# Patient Record
Sex: Female | Born: 1970 | Race: White | Hispanic: No | Marital: Married | Smoking: Never smoker
Health system: Southern US, Community
[De-identification: ages and names within clinical notes are randomized; demographics above are authoritative.]

## PROBLEM LIST (undated history)

## (undated) DIAGNOSIS — R51 Headache: Secondary | ICD-10-CM

## (undated) DIAGNOSIS — I10 Essential (primary) hypertension: Secondary | ICD-10-CM

## (undated) DIAGNOSIS — G40909 Epilepsy, unspecified, not intractable, without status epilepticus: Secondary | ICD-10-CM

## (undated) DIAGNOSIS — M549 Dorsalgia, unspecified: Secondary | ICD-10-CM

## (undated) DIAGNOSIS — G8929 Other chronic pain: Secondary | ICD-10-CM

## (undated) DIAGNOSIS — G35 Multiple sclerosis: Secondary | ICD-10-CM

## (undated) DIAGNOSIS — M1990 Unspecified osteoarthritis, unspecified site: Secondary | ICD-10-CM

## (undated) DIAGNOSIS — G43909 Migraine, unspecified, not intractable, without status migrainosus: Secondary | ICD-10-CM

## (undated) DIAGNOSIS — G35D Multiple sclerosis, unspecified: Secondary | ICD-10-CM

## (undated) DIAGNOSIS — J45909 Unspecified asthma, uncomplicated: Secondary | ICD-10-CM

## (undated) DIAGNOSIS — R519 Headache, unspecified: Secondary | ICD-10-CM

## (undated) DIAGNOSIS — J189 Pneumonia, unspecified organism: Secondary | ICD-10-CM

## (undated) DIAGNOSIS — K219 Gastro-esophageal reflux disease without esophagitis: Secondary | ICD-10-CM

## (undated) DIAGNOSIS — E7800 Pure hypercholesterolemia, unspecified: Secondary | ICD-10-CM

## (undated) DIAGNOSIS — E119 Type 2 diabetes mellitus without complications: Secondary | ICD-10-CM

## (undated) HISTORY — PX: TONSILLECTOMY: SUR1361

## (undated) HISTORY — PX: APPENDECTOMY: SHX54

## (undated) HISTORY — PX: CARDIAC VALVE REPLACEMENT: SHX585

---

## 2003-05-03 ENCOUNTER — Emergency Department (HOSPITAL_COMMUNITY): Admission: EM | Admit: 2003-05-03 | Discharge: 2003-05-03 | Payer: Self-pay | Admitting: Emergency Medicine

## 2018-12-07 ENCOUNTER — Other Ambulatory Visit: Payer: Self-pay

## 2018-12-07 ENCOUNTER — Encounter (HOSPITAL_COMMUNITY): Payer: Self-pay | Admitting: Emergency Medicine

## 2018-12-07 ENCOUNTER — Inpatient Hospital Stay (HOSPITAL_COMMUNITY)
Admission: EM | Admit: 2018-12-07 | Discharge: 2018-12-12 | DRG: 871 | Disposition: A | Discharge status: Skilled Nursing Facility | Payer: Medicaid Other | Attending: Internal Medicine | Admitting: Internal Medicine

## 2018-12-07 ENCOUNTER — Emergency Department (HOSPITAL_COMMUNITY): Payer: Medicaid Other

## 2018-12-07 DIAGNOSIS — I10 Essential (primary) hypertension: Secondary | ICD-10-CM | POA: Diagnosis present

## 2018-12-07 DIAGNOSIS — G35 Multiple sclerosis: Secondary | ICD-10-CM | POA: Diagnosis present

## 2018-12-07 DIAGNOSIS — J189 Pneumonia, unspecified organism: Secondary | ICD-10-CM | POA: Diagnosis present

## 2018-12-07 DIAGNOSIS — Z952 Presence of prosthetic heart valve: Secondary | ICD-10-CM | POA: Diagnosis not present

## 2018-12-07 DIAGNOSIS — J9601 Acute respiratory failure with hypoxia: Secondary | ICD-10-CM

## 2018-12-07 DIAGNOSIS — E1165 Type 2 diabetes mellitus with hyperglycemia: Secondary | ICD-10-CM | POA: Diagnosis present

## 2018-12-07 DIAGNOSIS — Y95 Nosocomial condition: Secondary | ICD-10-CM | POA: Diagnosis present

## 2018-12-07 DIAGNOSIS — F329 Major depressive disorder, single episode, unspecified: Secondary | ICD-10-CM | POA: Diagnosis present

## 2018-12-07 DIAGNOSIS — Z6834 Body mass index (BMI) 34.0-34.9, adult: Secondary | ICD-10-CM | POA: Diagnosis not present

## 2018-12-07 DIAGNOSIS — Z79899 Other long term (current) drug therapy: Secondary | ICD-10-CM | POA: Diagnosis not present

## 2018-12-07 DIAGNOSIS — A419 Sepsis, unspecified organism: Principal | ICD-10-CM | POA: Diagnosis present

## 2018-12-07 DIAGNOSIS — R569 Unspecified convulsions: Secondary | ICD-10-CM | POA: Diagnosis present

## 2018-12-07 DIAGNOSIS — E785 Hyperlipidemia, unspecified: Secondary | ICD-10-CM | POA: Diagnosis present

## 2018-12-07 DIAGNOSIS — Z7951 Long term (current) use of inhaled steroids: Secondary | ICD-10-CM | POA: Diagnosis not present

## 2018-12-07 DIAGNOSIS — K219 Gastro-esophageal reflux disease without esophagitis: Secondary | ICD-10-CM | POA: Diagnosis present

## 2018-12-07 DIAGNOSIS — Z885 Allergy status to narcotic agent status: Secondary | ICD-10-CM

## 2018-12-07 DIAGNOSIS — R633 Feeding difficulties: Secondary | ICD-10-CM | POA: Diagnosis present

## 2018-12-07 DIAGNOSIS — F419 Anxiety disorder, unspecified: Secondary | ICD-10-CM | POA: Diagnosis present

## 2018-12-07 DIAGNOSIS — J45901 Unspecified asthma with (acute) exacerbation: Secondary | ICD-10-CM | POA: Diagnosis present

## 2018-12-07 DIAGNOSIS — Z794 Long term (current) use of insulin: Secondary | ICD-10-CM | POA: Diagnosis not present

## 2018-12-07 DIAGNOSIS — E669 Obesity, unspecified: Secondary | ICD-10-CM

## 2018-12-07 DIAGNOSIS — E1169 Type 2 diabetes mellitus with other specified complication: Secondary | ICD-10-CM | POA: Diagnosis present

## 2018-12-07 DIAGNOSIS — R0602 Shortness of breath: Secondary | ICD-10-CM | POA: Diagnosis present

## 2018-12-07 HISTORY — DX: Multiple sclerosis: G35

## 2018-12-07 HISTORY — DX: Unspecified osteoarthritis, unspecified site: M19.90

## 2018-12-07 HISTORY — DX: Unspecified asthma, uncomplicated: J45.909

## 2018-12-07 HISTORY — DX: Pneumonia, unspecified organism: J18.9

## 2018-12-07 HISTORY — DX: Headache: R51

## 2018-12-07 HISTORY — DX: Other chronic pain: G89.29

## 2018-12-07 HISTORY — DX: Type 2 diabetes mellitus without complications: E11.9

## 2018-12-07 HISTORY — DX: Epilepsy, unspecified, not intractable, without status epilepticus: G40.909

## 2018-12-07 HISTORY — DX: Pure hypercholesterolemia, unspecified: E78.00

## 2018-12-07 HISTORY — DX: Essential (primary) hypertension: I10

## 2018-12-07 HISTORY — DX: Headache, unspecified: R51.9

## 2018-12-07 HISTORY — DX: Migraine, unspecified, not intractable, without status migrainosus: G43.909

## 2018-12-07 HISTORY — DX: Dorsalgia, unspecified: M54.9

## 2018-12-07 HISTORY — DX: Multiple sclerosis, unspecified: G35.D

## 2018-12-07 HISTORY — DX: Gastro-esophageal reflux disease without esophagitis: K21.9

## 2018-12-07 LAB — CBC WITH DIFFERENTIAL/PLATELET
Abs Immature Granulocytes: 0.03 10*3/uL (ref 0.00–0.07)
Basophils Absolute: 0 10*3/uL (ref 0.0–0.1)
Basophils Relative: 0 %
Eosinophils Absolute: 0.1 10*3/uL (ref 0.0–0.5)
Eosinophils Relative: 2 %
HCT: 34.2 % — ABNORMAL LOW (ref 36.0–46.0)
Hemoglobin: 9.9 g/dL — ABNORMAL LOW (ref 12.0–15.0)
Immature Granulocytes: 0 %
Lymphocytes Relative: 8 %
Lymphs Abs: 0.6 10*3/uL — ABNORMAL LOW (ref 0.7–4.0)
MCH: 22.2 pg — ABNORMAL LOW (ref 26.0–34.0)
MCHC: 28.9 g/dL — ABNORMAL LOW (ref 30.0–36.0)
MCV: 76.9 fL — ABNORMAL LOW (ref 80.0–100.0)
Monocytes Absolute: 0.4 10*3/uL (ref 0.1–1.0)
Monocytes Relative: 6 %
NEUTROS ABS: 6.4 10*3/uL (ref 1.7–7.7)
Neutrophils Relative %: 84 %
Platelets: 200 10*3/uL (ref 150–400)
RBC: 4.45 MIL/uL (ref 3.87–5.11)
RDW: 21 % — ABNORMAL HIGH (ref 11.5–15.5)
WBC: 7.6 10*3/uL (ref 4.0–10.5)
nRBC: 0 % (ref 0.0–0.2)

## 2018-12-07 LAB — COMPREHENSIVE METABOLIC PANEL
ALT: 30 U/L (ref 0–44)
AST: 26 U/L (ref 15–41)
Albumin: 3.4 g/dL — ABNORMAL LOW (ref 3.5–5.0)
Alkaline Phosphatase: 96 U/L (ref 38–126)
Anion gap: 8 (ref 5–15)
BUN: 15 mg/dL (ref 6–20)
CO2: 29 mmol/L (ref 22–32)
Calcium: 8.7 mg/dL — ABNORMAL LOW (ref 8.9–10.3)
Chloride: 102 mmol/L (ref 98–111)
Creatinine, Ser: 0.79 mg/dL (ref 0.44–1.00)
GFR calc Af Amer: >60 mL/min (ref >60)
GFR calc non Af Amer: >60 mL/min (ref >60)
Glucose, Bld: 142 mg/dL — ABNORMAL HIGH (ref 70–99)
Potassium: 4.4 mmol/L (ref 3.5–5.1)
Sodium: 139 mmol/L (ref 135–145)
Total Bilirubin: 0.6 mg/dL (ref 0.3–1.2)
Total Protein: 5.9 g/dL — ABNORMAL LOW (ref 6.5–8.1)

## 2018-12-07 LAB — URINALYSIS, ROUTINE W REFLEX MICROSCOPIC
Bilirubin Urine: NEGATIVE
Glucose, UA: NEGATIVE mg/dL
Hgb urine dipstick: NEGATIVE
Ketones, ur: NEGATIVE mg/dL
Leukocytes, UA: NEGATIVE
Nitrite: NEGATIVE
Protein, ur: NEGATIVE mg/dL
Specific Gravity, Urine: 1.017 (ref 1.005–1.030)
pH: 5 (ref 5.0–8.0)

## 2018-12-07 LAB — I-STAT ARTERIAL BLOOD GAS, ED
Acid-Base Excess: 3 mmol/L — ABNORMAL HIGH (ref 0.0–2.0)
Bicarbonate: 27.8 mmol/L (ref 20.0–28.0)
O2 Saturation: 99 %
PH ART: 7.369 (ref 7.350–7.450)
Patient temperature: 102.7
TCO2: 29 mmol/L (ref 22–32)
pCO2 arterial: 49.4 mmHg — ABNORMAL HIGH (ref 32.0–48.0)
pO2, Arterial: 144 mmHg — ABNORMAL HIGH (ref 83.0–108.0)

## 2018-12-07 LAB — GLUCOSE, CAPILLARY
Glucose-Capillary: 164 mg/dL — ABNORMAL HIGH (ref 70–99)
Glucose-Capillary: 252 mg/dL — ABNORMAL HIGH (ref 70–99)
Glucose-Capillary: 326 mg/dL — ABNORMAL HIGH (ref 70–99)

## 2018-12-07 LAB — RESPIRATORY PANEL BY PCR
Adenovirus: NOT DETECTED
Bordetella pertussis: NOT DETECTED
CORONAVIRUS HKU1-RVPPCR: NOT DETECTED
Chlamydophila pneumoniae: NOT DETECTED
Coronavirus 229E: NOT DETECTED
Coronavirus NL63: NOT DETECTED
Coronavirus OC43: NOT DETECTED
Influenza A: NOT DETECTED
Influenza B: NOT DETECTED
Metapneumovirus: NOT DETECTED
Mycoplasma pneumoniae: NOT DETECTED
PARAINFLUENZA VIRUS 1-RVPPCR: NOT DETECTED
Parainfluenza Virus 2: NOT DETECTED
Parainfluenza Virus 3: NOT DETECTED
Parainfluenza Virus 4: NOT DETECTED
Respiratory Syncytial Virus: NOT DETECTED
Rhinovirus / Enterovirus: NOT DETECTED

## 2018-12-07 LAB — INFLUENZA PANEL BY PCR (TYPE A & B)
Influenza A By PCR: NEGATIVE
Influenza B By PCR: NEGATIVE

## 2018-12-07 LAB — MRSA PCR SCREENING: MRSA by PCR: POSITIVE — AB

## 2018-12-07 LAB — I-STAT CG4 LACTIC ACID, ED
Lactic Acid, Venous: 1.39 mmol/L (ref 0.5–1.9)
Lactic Acid, Venous: 0.91 mmol/L (ref 0.5–1.9)

## 2018-12-07 LAB — HEMOGLOBIN A1C
Hgb A1c MFr Bld: 8.6 % — ABNORMAL HIGH (ref 4.8–5.6)
Mean Plasma Glucose: 200.12 mg/dL

## 2018-12-07 LAB — PROCALCITONIN: Procalcitonin: 0.78 ng/mL

## 2018-12-07 LAB — I-STAT TROPONIN, ED: Troponin i, poc: 0.01 ng/mL (ref 0.00–0.08)

## 2018-12-07 LAB — BRAIN NATRIURETIC PEPTIDE: B Natriuretic Peptide: 70.7 pg/mL (ref 0.0–100.0)

## 2018-12-07 MED ORDER — SODIUM CHLORIDE 0.9 % IV SOLN
2.0000 g | Freq: Once | INTRAVENOUS | Status: AC
  Administered 2018-12-07: 2 g via INTRAVENOUS
  Filled 2018-12-07: qty 2

## 2018-12-07 MED ORDER — ONDANSETRON HCL 4 MG/2ML IJ SOLN: 4.0000 mg | Freq: Four times a day (QID) | INTRAMUSCULAR | Status: DC | PRN

## 2018-12-07 MED ORDER — ALBUTEROL SULFATE (2.5 MG/3ML) 0.083% IN NEBU: 2.5000 mg | INHALATION_SOLUTION | RESPIRATORY_TRACT | Status: DC | PRN

## 2018-12-07 MED ORDER — ENOXAPARIN SODIUM 40 MG/0.4ML ~~LOC~~ SOLN
40.0000 mg | SUBCUTANEOUS | Status: DC
  Administered 2018-12-07 – 2018-12-12 (×6): 40 mg via SUBCUTANEOUS
  Filled 2018-12-07 (×7): qty 0.4

## 2018-12-07 MED ORDER — PREGABALIN 100 MG PO CAPS
300.0000 mg | ORAL_CAPSULE | Freq: Two times a day (BID) | ORAL | Status: DC
  Administered 2018-12-07 – 2018-12-12 (×11): 300 mg via ORAL
  Filled 2018-12-07 (×5): qty 3
  Filled 2018-12-07: qty 6
  Filled 2018-12-07 (×5): qty 3

## 2018-12-07 MED ORDER — VANCOMYCIN HCL IN DEXTROSE 1-5 GM/200ML-% IV SOLN
1000.0000 mg | Freq: Once | INTRAVENOUS | Status: DC
  Filled 2018-12-07: qty 200

## 2018-12-07 MED ORDER — SODIUM CHLORIDE 0.9 % IV BOLUS (SEPSIS)
1000.0000 mL | Freq: Once | INTRAVENOUS | Status: AC
  Administered 2018-12-07: 1000 mL via INTRAVENOUS

## 2018-12-07 MED ORDER — ACETAMINOPHEN 325 MG PO TABS
650.0000 mg | ORAL_TABLET | Freq: Four times a day (QID) | ORAL | Status: DC | PRN
  Administered 2018-12-11 (×3): 650 mg via ORAL
  Filled 2018-12-07 (×3): qty 2

## 2018-12-07 MED ORDER — LEVETIRACETAM 500 MG PO TABS
1000.0000 mg | ORAL_TABLET | Freq: Two times a day (BID) | ORAL | Status: DC
  Administered 2018-12-07 – 2018-12-12 (×11): 1000 mg via ORAL
  Filled 2018-12-07 (×11): qty 2

## 2018-12-07 MED ORDER — ACETAMINOPHEN 650 MG RE SUPP: 650.0000 mg | Freq: Four times a day (QID) | RECTAL | Status: DC | PRN

## 2018-12-07 MED ORDER — ATORVASTATIN CALCIUM 10 MG PO TABS
20.0000 mg | ORAL_TABLET | Freq: Every day | ORAL | Status: DC
  Administered 2018-12-07 – 2018-12-11 (×5): 20 mg via ORAL
  Filled 2018-12-07 (×6): qty 2

## 2018-12-07 MED ORDER — BUDESONIDE 0.25 MG/2ML IN SUSP
0.2500 mg | Freq: Two times a day (BID) | RESPIRATORY_TRACT | Status: DC
  Administered 2018-12-07 – 2018-12-12 (×10): 0.25 mg via RESPIRATORY_TRACT
  Filled 2018-12-07 (×12): qty 2

## 2018-12-07 MED ORDER — DULOXETINE HCL 60 MG PO CPEP
60.0000 mg | ORAL_CAPSULE | Freq: Every day | ORAL | Status: DC
  Administered 2018-12-07 – 2018-12-12 (×6): 60 mg via ORAL
  Filled 2018-12-07 (×6): qty 1

## 2018-12-07 MED ORDER — DOCUSATE SODIUM 100 MG PO CAPS
100.0000 mg | ORAL_CAPSULE | Freq: Two times a day (BID) | ORAL | Status: DC
  Administered 2018-12-07 – 2018-12-12 (×11): 100 mg via ORAL
  Filled 2018-12-07 (×11): qty 1

## 2018-12-07 MED ORDER — IPRATROPIUM-ALBUTEROL 0.5-2.5 (3) MG/3ML IN SOLN
3.0000 mL | Freq: Four times a day (QID) | RESPIRATORY_TRACT | Status: DC
  Administered 2018-12-07 – 2018-12-08 (×6): 3 mL via RESPIRATORY_TRACT
  Filled 2018-12-07 (×6): qty 3

## 2018-12-07 MED ORDER — VANCOMYCIN HCL 10 G IV SOLR
1500.0000 mg | Freq: Once | INTRAVENOUS | Status: AC
  Administered 2018-12-07: 1000 mg via INTRAVENOUS
  Filled 2018-12-07: qty 1500

## 2018-12-07 MED ORDER — POLYSACCHARIDE IRON COMPLEX 150 MG PO CAPS
150.0000 mg | ORAL_CAPSULE | Freq: Every day | ORAL | Status: DC
  Administered 2018-12-07 – 2018-12-12 (×6): 150 mg via ORAL
  Filled 2018-12-07 (×6): qty 1

## 2018-12-07 MED ORDER — INSULIN GLARGINE 100 UNIT/ML ~~LOC~~ SOLN
45.0000 [IU] | Freq: Every day | SUBCUTANEOUS | Status: DC
  Administered 2018-12-07 – 2018-12-09 (×3): 45 [IU] via SUBCUTANEOUS
  Filled 2018-12-07 (×3): qty 0.45

## 2018-12-07 MED ORDER — FLUTICASONE FUROATE-VILANTEROL 100-25 MCG/INH IN AEPB
1.0000 | INHALATION_SPRAY | Freq: Every day | RESPIRATORY_TRACT | Status: DC
  Administered 2018-12-08 – 2018-12-12 (×5): 1 via RESPIRATORY_TRACT
  Filled 2018-12-07: qty 28

## 2018-12-07 MED ORDER — AMITRIPTYLINE HCL 10 MG PO TABS
30.0000 mg | ORAL_TABLET | Freq: Every day | ORAL | Status: DC
  Administered 2018-12-07 – 2018-12-11 (×5): 30 mg via ORAL
  Filled 2018-12-07 (×6): qty 3

## 2018-12-07 MED ORDER — PANTOPRAZOLE SODIUM 40 MG PO TBEC
40.0000 mg | DELAYED_RELEASE_TABLET | Freq: Every day | ORAL | Status: DC
  Administered 2018-12-07 – 2018-12-12 (×6): 40 mg via ORAL
  Filled 2018-12-07 (×6): qty 1

## 2018-12-07 MED ORDER — DICYCLOMINE HCL 10 MG PO CAPS
10.0000 mg | ORAL_CAPSULE | Freq: Two times a day (BID) | ORAL | Status: DC
  Administered 2018-12-07 – 2018-12-12 (×11): 10 mg via ORAL
  Filled 2018-12-07 (×12): qty 1

## 2018-12-07 MED ORDER — SODIUM CHLORIDE 0.9 % IV BOLUS (SEPSIS): 1000.0000 mL | Freq: Once | INTRAVENOUS | Status: DC

## 2018-12-07 MED ORDER — CARVEDILOL 6.25 MG PO TABS
6.2500 mg | ORAL_TABLET | Freq: Two times a day (BID) | ORAL | Status: DC
  Administered 2018-12-07 – 2018-12-12 (×11): 6.25 mg via ORAL
  Filled 2018-12-07 (×12): qty 1

## 2018-12-07 MED ORDER — ONDANSETRON HCL 4 MG PO TABS
4.0000 mg | ORAL_TABLET | Freq: Four times a day (QID) | ORAL | Status: DC | PRN
  Administered 2018-12-07: 4 mg via ORAL
  Filled 2018-12-07: qty 1

## 2018-12-07 MED ORDER — INSULIN ASPART 100 UNIT/ML ~~LOC~~ SOLN
0.0000 [IU] | Freq: Three times a day (TID) | SUBCUTANEOUS | Status: DC
  Administered 2018-12-07: 8 [IU] via SUBCUTANEOUS
  Administered 2018-12-08: 15 [IU] via SUBCUTANEOUS
  Administered 2018-12-08: 11 [IU] via SUBCUTANEOUS

## 2018-12-07 MED ORDER — METHYLPREDNISOLONE SODIUM SUCC 125 MG IJ SOLR
60.0000 mg | Freq: Two times a day (BID) | INTRAMUSCULAR | Status: DC
  Administered 2018-12-07 – 2018-12-09 (×4): 60 mg via INTRAVENOUS
  Filled 2018-12-07 (×4): qty 2

## 2018-12-07 NOTE — H&P (Signed)
History and Physical    Tracy Riddle URK:[id number] DOB: 1971/08/08 DOA: 12/07/2018  PCP: Camie Patience, FNP Consultants:  Loletta Parish - pain management; Levan Hurst - surgery; Rocky Crafts - GI; Kilpatrick - neurosurgery Patient coming from:  [facility] for rehab; usually lives with husband in an apartment; NOK: Husband, [phone]  Chief Complaint: SOB  HPI: Tracy Riddle is a 47 y.o. female with medical history significant of MS; GERD; DM; and asthma presenting with  SOB.She started coughing and was unable to catch her breath.  The facility called 911; [facility] was on diversion and so she was brought to [facility].  Coughing started last night.  Nonproductive cough.  +fever.  +wheezing.  She has h/o asthma, no h/o intubation.  She got better with BIPAP and is feeling better, now off BIPAP.  She was feeling well prior to yesterday.  No sick contacts.  ED Course:  Respiratory distress - usually seen at [facility].  Fever, cough, no CP.  H/o asthma - given steroids and neb by EMS.  Clinically appears to be PNA, covered for HCAP. Lactate normal so not given full bolus (possibly volume, unlikely).  Had been hypoxic into the 70s.  May be able to come off BIPAP soon.  Review of Systems: As per HPI; otherwise review of systems reviewed and negative.   Ambulatory Status:  Ambulates with a cane or walker; uses a wheelchair when out  Past Medical History:  Diagnosis Date  . Asthma   . Diabetes mellitus without complication (HCC)   . GERD (gastroesophageal reflux disease)   . Multiple sclerosis (HCC)     Past Surgical History:  Procedure Laterality Date  . CARDIAC SURGERY     valve replacement; not on [facility]    Social History   Socioeconomic History  . Marital status: Married    Spouse name: Not on file  . Number of children: Not on file  . Years of education: Not on file  . Highest education level: Not on file  Occupational History  . Occupation: disabled  Social Needs  . Financial resource strain: Not on file  .  Food insecurity:    Worry: Not on file    Inability: Not on file  . Transportation needs:    Medical: Not on file    Non-medical: Not on file  Tobacco Use  . Smoking status: Never Smoker  . Smokeless tobacco: Never Used  Substance and Sexual Activity  . Alcohol use: Not Currently  . Drug use: Not on file  . Sexual activity: Not Currently  Lifestyle  . Physical activity:    Days per week: Not on file    Minutes per session: Not on file  . Stress: Not on file  Relationships  . Social connections:    Talks on phone: Not on file    Gets together: Not on file    Attends religious service: Not on file    Active member of club or organization: Not on file    Attends meetings of clubs or organizations: Not on file    Relationship status: Not on file  . Intimate partner violence:    Fear of current or ex partner: Not on file    Emotionally abused: Not on file    Physically abused: Not on file    Forced sexual activity: Not on file  Other Topics Concern  . Not on file  Social History Narrative  . Not on file    Allergies  Allergen Reactions  . Codeine Itching  .  Oxycodone Itching    History reviewed. No pertinent family history.  Prior to Admission medications   Medication Sig Start Date End Date Taking? Authorizing Provider  albuterol (PROVENTIL HFA;VENTOLIN HFA) 108 (90 Base) MCG/ACT inhaler Inhale 2 puffs into the lungs every 4 (four) hours as needed for wheezing or shortness of breath.   Yes [provider]  amitriptyline (ELAVIL) 10 MG tablet Take 30 mg by mouth at bedtime.   Yes [provider]  atorvastatin (LIPITOR) 20 MG tablet Take 20 mg by mouth at bedtime.   Yes [provider]  carvedilol (COREG) 6.25 MG tablet Take 6.25 mg by mouth 2 (two) times daily with a meal.   Yes [provider]  dicyclomine (BENTYL) 10 MG capsule Take 10 mg by mouth 2 (two) times daily.   Yes [provider]  DULoxetine (CYMBALTA) 60 MG  capsule Take 60 mg by mouth daily.   Yes [provider]  Fluticasone Furoate (ARNUITY ELLIPTA) 100 MCG/ACT AEPB Inhale 1 puff into the lungs daily.   Yes [provider]  fluticasone furoate-vilanterol (BREO ELLIPTA) 100-25 MCG/INH AEPB Inhale 1 puff into the lungs daily.   Yes [provider]  furosemide (LASIX) 20 MG tablet Take 20 mg by mouth as needed for edema.   Yes [provider]  insulin aspart (NOVOLOG) 100 UNIT/ML injection Inject 8 Units into the skin 3 (three) times daily before meals.   Yes [provider]  insulin glargine (LANTUS) 100 UNIT/ML injection Inject 45 Units into the skin daily.   Yes [provider]  ipratropium-albuterol (DUONEB) 0.5-2.5 (3) MG/3ML SOLN Take 3 mLs by nebulization every 6 (six) hours as needed (for wheezing).   Yes [provider]  iron polysaccharides (NIFEREX) 150 MG capsule Take 150 mg by mouth daily.   Yes [provider]  levETIRAcetam (KEPPRA) 1000 MG tablet Take 1,000 mg by mouth 2 (two) times daily.   Yes [provider]  liraglutide (VICTOZA) 18 MG/3ML SOPN Inject 1.2 mg into the skin daily.   Yes [provider]  magnesium oxide (MAG-OX) 400 MG tablet Take 400 mg by mouth 2 (two) times daily.   Yes [provider]  meloxicam (MOBIC) 15 MG tablet Take 15 mg by mouth daily.   Yes [provider]  metFORMIN (GLUCOPHAGE) 1000 MG tablet Take 1,000 mg by mouth 2 (two) times daily with a meal.   Yes [provider]  METHEN-HYOSC-METH BLUE-NA PHOS PO Take 81.6 mg by mouth 4 (four) times daily.   Yes [provider]  pantoprazole (PROTONIX) 40 MG tablet Take 40 mg by mouth daily.   Yes [provider]  pregabalin (LYRICA) 150 MG capsule Take 300 mg by mouth 2 (two) times daily.   Yes [provider]    Physical Exam: Vitals:   12/07/18 0752 12/07/18 0800 12/07/18 1000 12/07/18 1122  BP:  138/81 139/89 139/89    Pulse: (!) 102 (!) 101 95 96  Resp: (!) 23 20 16    Temp:   98.3 F (36.8 C) 98.3 F (36.8 C)  TempSrc:   Oral Oral  SpO2: 98% 97% 98% 96%  Weight:    86.2 kg  Height:    5\' 4"  (1.626 m)     General:   Appears calm and comfortable and is NAD Eyes:  PERRL, EOMI, normal lids, iris ENT:  grossly normal hearing, lips & tongue, mmm Neck:  no LAD, masses or thyromegaly Cardiovascular:  RRR, no m/r/g.  No LE edema.  Respiratory:  Bibasilar rhonchi with wheezing.   Normal respiratory effort on [place] O2. Abdomen:  soft, NT, ND, NABS Back:   normal alignment, no CVAT Skin:  no rash or induration seen on limited exam Musculoskeletal:  grossly normal tone BUE/BLE, good ROM, no bony abnormality Psychiatric:  grossly normal mood and affect, speech fluent and appropriate, AOx3 Neurologic:  CN 2-12 grossly intact, moves all extremities in coordinated fashion, sensation intact    Radiological Exams on Admission: Dg Chest Portable 1 View  Result Date: 12/07/2018 CLINICAL DATA:  Shortness of breath EXAM: PORTABLE CHEST 1 VIEW COMPARISON:  Chest radiograph 11/07/2018 FINDINGS: Shallow lung inflation. Patchy bilateral opacities, right greater than left. No pneumothorax or sizable pleural effusion. IMPRESSION: Shallow lung inflation and patchy bilateral opacities, favored to be pulmonary edema. Electronically Signed   By: Deatra Robinson M.D.   On: 12/07/2018 06:16    EKG: Independently reviewed.  Sinus tachycardia with rate 120; nonspecific ST changes with no evidence of acute ischemia   Labs on Admission: I have personally reviewed the available labs and imaging studies at the time of the admission.  Pertinent labs:   Glucose 142 BNP 70.7 Troponin 0.01 WBC 7.5 Hgb 9.9 Lactate 1.39, 0.91  Assessment/Plan Principal Problem:   Acute respiratory failure with hypoxia (HCC) Active Problems:   Sepsis (HCC)   Asthma, chronic, unspecified asthma severity, with acute exacerbation   Diabetes  mellitus type 2 in obese (HCC)   Acute respiratory failure with hypoxia -Patient presented with acute respiratory failure requiring BIPAP -She was treated for sepsis with Cefepime and Vanc -Her symptoms improved fairly quickly and at the time of my evaluation she was resting comfortably on 2L [place] O2 -Patient was admitted to SDU and has been downgraded to telemetry  Sepsis -SIRS criteria in this patient includes: Fever, tachycardia, tachypnea  -Patient has NO evidence of acute organ failure at this time -While awaiting blood cultures, this may be a preseptic condition. -Sepsis protocol initiated -Blood and urine cultures pending -Will admit with telemetry and continue to monitor -Respiratory viral illness seems more likely than bacterial illness at this time; will hold additional antibiotics for now pending clinical progress as well as procalcitonin result -Will add HIV -Will order lower respiratory tract procalcitonin level.  Antibiotics would not be indicated for PCT <0.1 and probably should not be used for < 0.25.  >0.5 indicates infection and >>0.5 indicates more serious disease.  As the procalcitonin level normalizes, it will be reasonable to consider de-escalation of antibiotic coverage.  Asthma exacerbation -She has history of severe asthma without history of intubation but with multiple hospital admissions including 11/16-22. -Morbid obesity, intermittent tobacco use are likely exacerbating factors. 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) -Also with depression/anxiety -Reported h/o MS but the only MRI of the  brain I find is from 10/17 and did not demonstrate apparent demyelination -Note from neurology on 02/10/17 does not indicate a diagnosis of MS -Continue home meds: Elavil, Cymbalta, Keppra, Lyrica  HTN -Continue Coreg  HLD -Continue Lipitor  VT prophylaxis:  Eliquis Code Status:  Full - confirmed with patient/family Family Communication: Son present throughout evaluation  Disposition Plan:  Home  once clinically improved Consults called: None  Admission status: Admit - It is my clinical opinion that admission to INPATIENT is reasonable and necessary because of the expectation that this patient will require hospital care that crosses at least 2 midnights to treat this condition based on the medical complexity of the problems presented.  Given the aforementioned information, the predictability of an adverse outcome is felt to be significant.     Jonah Blue MD [facility]  If note is complete, please contact covering daytime or nighttime physician. [email or web address] Password [facility]  12/07/2018, 11:47 AM

## 2018-12-07 NOTE — ED Triage Notes (Signed)
BIB [facility] from [facility] with c/o of SOB. Pt placed on CPAP after not responding to any other O2 measures. Pt received 3 duo-nebs and 125 sou-medrol. Temp 102.7 rectally on arrival.

## 2018-12-07 NOTE — ED Provider Notes (Signed)
[facility] EMERGENCY DEPARTMENT Provider Note   CSN: [id number] Arrival date & time: 12/07/18  4098     History   Chief Complaint Chief Complaint  Patient presents with  . Shortness of Breath    HPI Tracy Riddle is a 47 y.o. female.  HPI  This is a 47 year old female with a history of asthma, diabetes, multiple sclerosis who presents with respiratory distress.  Patient presents from her living facility.  Staff noted increased work of breathing and respiratory distress 3 to 4 hours ago.  Noted O2 sats in the 70s.  Patient with a history of asthma.  She did not respond to DuoNeb's.  On EMS arrival was noted to be in the low 90s on 3 L of oxygen.  EMS noted coarse breath sounds.  She was given Solu-Medrol and a DuoNeb.  She was placed on BiPAP and O2 sats increased to the mid 90s.  Patient reports that she feels much more comfortable on BiPAP.  No history of heart failure.  Reports a cough that has been productive.  No noted fevers.  She denies any chest pain, nausea, vomiting, abdominal pain.  Past Medical History:  Diagnosis Date  . Asthma   . Diabetes mellitus without complication (HCC)   . GERD (gastroesophageal reflux disease)   . Multiple sclerosis (HCC)     There are no active problems to display for this patient.   OB History   No obstetric history on file.      Home Medications    Prior to Admission medications   Medication Sig Start Date End Date Taking? Authorizing Provider  albuterol (PROVENTIL HFA;VENTOLIN HFA) 108 (90 Base) MCG/ACT inhaler Inhale 2 puffs into the lungs every 4 (four) hours as needed for wheezing or shortness of breath.   Yes [provider]  amitriptyline (ELAVIL) 10 MG tablet Take 30 mg by mouth at bedtime.   Yes [provider]  atorvastatin (LIPITOR) 20 MG tablet Take 20 mg by mouth at bedtime.   Yes [provider]  carvedilol (COREG) 6.25 MG tablet Take 6.25 mg by mouth 2 (two) times daily with  a meal.   Yes [provider]  dicyclomine (BENTYL) 10 MG capsule Take 10 mg by mouth 2 (two) times daily.   Yes [provider]  DULoxetine (CYMBALTA) 60 MG capsule Take 60 mg by mouth daily.   Yes [provider]  Fluticasone Furoate (ARNUITY ELLIPTA) 100 MCG/ACT AEPB Inhale 1 puff into the lungs daily.   Yes [provider]  fluticasone furoate-vilanterol (BREO ELLIPTA) 100-25 MCG/INH AEPB Inhale 1 puff into the lungs daily.   Yes [provider]  furosemide (LASIX) 20 MG tablet Take 20 mg by mouth as needed for edema.   Yes [provider]  insulin aspart (NOVOLOG) 100 UNIT/ML injection Inject 8 Units into the skin 3 (three) times daily before meals.   Yes [provider]  insulin glargine (LANTUS) 100 UNIT/ML injection Inject 45 Units into the skin daily.   Yes [provider]  ipratropium-albuterol (DUONEB) 0.5-2.5 (3) MG/3ML SOLN Take 3 mLs by nebulization every 6 (six) hours as needed (for wheezing).   Yes [provider]  iron polysaccharides (NIFEREX) 150 MG capsule Take 150 mg by mouth daily.   Yes [provider]  levETIRAcetam (KEPPRA) 1000 MG tablet Take 1,000 mg by mouth 2 (two) times daily.   Yes [provider]  liraglutide (VICTOZA) 18 MG/3ML SOPN Inject 1.2 mg into the  skin daily.   Yes [provider]  magnesium oxide (MAG-OX) 400 MG tablet Take 400 mg by mouth 2 (two) times daily.   Yes [provider]  meloxicam (MOBIC) 15 MG tablet Take 15 mg by mouth daily.   Yes [provider]  metFORMIN (GLUCOPHAGE) 1000 MG tablet Take 1,000 mg by mouth 2 (two) times daily with a meal.   Yes [provider]  METHEN-HYOSC-METH BLUE-NA PHOS PO Take 81.6 mg by mouth 4 (four) times daily.   Yes [provider]  pantoprazole (PROTONIX) 40 MG tablet Take 40 mg by mouth daily.   Yes [provider]  pregabalin (LYRICA) 150 MG capsule Take 300 mg  by mouth 2 (two) times daily.   Yes [provider]    Family History No family history on file.  Social History Social History   Tobacco Use  . Smoking status: Never Smoker  . Smokeless tobacco: Never Used  Substance Use Topics  . Alcohol use: Not Currently  . Drug use: Not on file     Allergies   Codeine and Oxycodone   Review of Systems Review of Systems  Constitutional: Positive for fever.  Respiratory: Positive for cough and shortness of breath. Negative for wheezing.   Cardiovascular: Negative for chest pain.  Gastrointestinal: Negative for abdominal pain, nausea and vomiting.  Genitourinary: Negative for dysuria.  All other systems reviewed and are negative.    Physical Exam Updated Vital Signs BP (!) 134/94   Pulse (!) 120   Temp (!) 102.7 F (39.3 C) (Rectal)   Resp (!) 23   Ht 1.575 m (5\' 2" )   Wt 86.2 kg   SpO2 100%   BMI 34.75 kg/m   Physical Exam Vitals signs and nursing note reviewed.  Constitutional:      Appearance: She is well-developed. She is ill-appearing. She is not toxic-appearing.     Comments: Obese, CPAP in place, no acute distress  HENT:     Head: Normocephalic and atraumatic.  Neck:     Musculoskeletal: Neck supple.  Cardiovascular:     Rate and Rhythm: Regular rhythm.     Heart sounds: Normal heart sounds.     Comments: Tachycardia Pulmonary:     Effort: Tachypnea and respiratory distress present.     Breath sounds: No wheezing.     Comments: Coarse breath sounds right lung fields, fair aeration, no wheezing Abdominal:     General: Bowel sounds are normal.     Palpations: Abdomen is soft.  Musculoskeletal:     Right lower leg: Edema present.     Left lower leg: Edema present.     Comments: Trace to 1+ bilateral lower extremity edema  Skin:    General: Skin is warm and dry.  Neurological:     Mental Status: She is alert and oriented to person, place, and time.  Psychiatric:        Mood and Affect: Mood  normal.      ED Treatments / Results  Labs (all labs ordered are listed, but only abnormal results are displayed) Labs Reviewed  CBC WITH DIFFERENTIAL/PLATELET - Abnormal; Notable for the following components:      Result Value   Hemoglobin 9.9 (*)    HCT 34.2 (*)    MCV 76.9 (*)    MCH 22.2 (*)    MCHC 28.9 (*)    RDW 21.0 (*)    Lymphs Abs 0.6 (*)    All other components within normal  limits  COMPREHENSIVE METABOLIC PANEL - Abnormal; Notable for the following components:   Glucose, Bld 142 (*)    Calcium 8.7 (*)    Total Protein 5.9 (*)    Albumin 3.4 (*)    All other components within normal limits  I-STAT ARTERIAL BLOOD GAS, ED - Abnormal; Notable for the following components:   pCO2 arterial 49.4 (*)    pO2, Arterial 144.0 (*)    Acid-Base Excess 3.0 (*)    All other components within normal limits  CULTURE, BLOOD (ROUTINE X 2)  CULTURE, BLOOD (ROUTINE X 2)  BRAIN NATRIURETIC PEPTIDE  URINALYSIS, ROUTINE W REFLEX MICROSCOPIC  INFLUENZA PANEL BY PCR (TYPE A & B)  I-STAT TROPONIN, ED  I-STAT CG4 LACTIC ACID, ED    EKG EKG Interpretation  Date/Time:  Wednesday December 07 2018 05:46:18 EST Ventricular Rate:  120 PR Interval:    QRS Duration: 95 QT Interval:  348 QTC Calculation: 492 R Axis:   11 Text Interpretation:  Sinus tachycardia Probable left atrial enlargement Low voltage, precordial leads Anteroseptal infarct, old Nonspecific T abnormalities, lateral leads Confirmed by Ross Marcus ([id number]) on 12/07/2018 5:48:09 AM   Radiology Dg Chest Portable 1 View  Result Date: 12/07/2018 CLINICAL DATA:  Shortness of breath EXAM: PORTABLE CHEST 1 VIEW COMPARISON:  Chest radiograph 11/07/2018 FINDINGS: Shallow lung inflation. Patchy bilateral opacities, right greater than left. No pneumothorax or sizable pleural effusion. IMPRESSION: Shallow lung inflation and patchy bilateral opacities, favored to be pulmonary edema. Electronically Signed   By: Deatra Robinson  M.D.   On: 12/07/2018 06:16    Procedures Procedures (including critical care time)  CRITICAL CARE Performed by: Shon Baton   Total critical care time: 45 minutes  Critical care time was exclusive of separately billable procedures and treating other patients.  Critical care was necessary to treat or prevent imminent or life-threatening deterioration.  Critical care was time spent personally by me on the following activities: development of treatment plan with patient and/or surrogate as well as nursing, discussions with consultants, evaluation of patient's response to treatment, examination of patient, obtaining history from patient or surrogate, ordering and performing treatments and interventions, ordering and review of laboratory studies, ordering and review of radiographic studies, pulse oximetry and re-evaluation of patient's condition.   Medications Ordered in ED Medications  vancomycin (VANCOCIN) 1,500 mg in sodium chloride 0.9 % 500 mL IVPB (1,000 mg Intravenous New Bag/Given 12/07/18 0604)  sodium chloride 0.9 % bolus 1,000 mL (1,000 mLs Intravenous New Bag/Given 12/07/18 0603)    And  sodium chloride 0.9 % bolus 1,000 mL (1,000 mLs Intravenous New Bag/Given 12/07/18 0601)  ceFEPIme (MAXIPIME) 2 g in sodium chloride 0.9 % 100 mL IVPB (2 g Intravenous New Bag/Given 12/07/18 0602)     Initial Impression / Assessment and Plan / ED Course  I have reviewed the triage vital signs and the nursing notes.  Pertinent labs & imaging results that were available during my care of the patient were reviewed by me and considered in my medical decision making (see chart for details).  Clinical Course as of Dec 07 656  Wed Dec 07, 2018  1914 Chest x-ray with patchy opacities favoring pulmonary edema.  Clinically however, patient is febrile.  Pneumonia is certainly a consideration.  We will hold further fluid resuscitation and cover broadly with antibiotics as well.   [CH]      Clinical Course User Index [CH] Horton, Mayer Masker, MD    Patient presents with shortness  of breath and respiratory distress.  Noted to be hypoxic by EMS.  Placed on CPAP.  Vital signs notable for temperature of 102.7.  Pulse rate 120.  Respirations 23.  She was placed on BiPAP.  Sepsis work-up initiated.  30 cc/kg bolus and coverage for healthcare associated pneumonia given symptoms.  Lactate is normal.  No significant leukocytosis.  Chest x-ray shows bibasilar opacities favoring pulmonary edema.  She does have some slight swelling on lower extremity exam.  However, given her fever, would favor pneumonia.  Given that her lactate and blood pressure are normal.  Further fluids were stopped.  ABG is compensated.  We will plan for admission for further management.  Patient received steroids and a DuoNeb in route.  Final Clinical Impressions(s) / ED Diagnoses   Final diagnoses:  Acute respiratory failure with hypoxia (HCC)  Healthcare-associated pneumonia    ED Discharge Orders    None       Horton, Mayer Masker, MD 12/07/18 0700

## 2018-12-08 LAB — CBC
HCT: 31.1 % — ABNORMAL LOW (ref 36.0–46.0)
Hemoglobin: 9.3 g/dL — ABNORMAL LOW (ref 12.0–15.0)
MCH: 22.4 pg — ABNORMAL LOW (ref 26.0–34.0)
MCHC: 29.9 g/dL — ABNORMAL LOW (ref 30.0–36.0)
MCV: 74.8 fL — ABNORMAL LOW (ref 80.0–100.0)
Platelets: 207 10*3/uL (ref 150–400)
RBC: 4.16 MIL/uL (ref 3.87–5.11)
RDW: 20.9 % — ABNORMAL HIGH (ref 11.5–15.5)
WBC: 8.4 10*3/uL (ref 4.0–10.5)
nRBC: 0 % (ref 0.0–0.2)

## 2018-12-08 LAB — BASIC METABOLIC PANEL
Anion gap: 9 (ref 5–15)
BUN: 19 mg/dL (ref 6–20)
CO2: 27 mmol/L (ref 22–32)
Calcium: 8.8 mg/dL — ABNORMAL LOW (ref 8.9–10.3)
Chloride: 103 mmol/L (ref 98–111)
Creatinine, Ser: 0.73 mg/dL (ref 0.44–1.00)
GFR calc Af Amer: >60 mL/min (ref >60)
GFR calc non Af Amer: >60 mL/min (ref >60)
Glucose, Bld: 352 mg/dL — ABNORMAL HIGH (ref 70–99)
Potassium: 4.5 mmol/L (ref 3.5–5.1)
Sodium: 139 mmol/L (ref 135–145)

## 2018-12-08 LAB — GLUCOSE, CAPILLARY
GLUCOSE-CAPILLARY: 387 mg/dL — AB (ref 70–99)
GLUCOSE-CAPILLARY: 402 mg/dL — AB (ref 70–99)
Glucose-Capillary: 331 mg/dL — ABNORMAL HIGH (ref 70–99)
Glucose-Capillary: 348 mg/dL — ABNORMAL HIGH (ref 70–99)

## 2018-12-08 LAB — HIV ANTIBODY (ROUTINE TESTING W REFLEX): HIV Screen 4th Generation wRfx: NONREACTIVE

## 2018-12-08 MED ORDER — BENZONATATE 100 MG PO CAPS
100.0000 mg | ORAL_CAPSULE | Freq: Three times a day (TID) | ORAL | Status: DC
  Administered 2018-12-08 – 2018-12-12 (×14): 100 mg via ORAL
  Filled 2018-12-08 (×14): qty 1

## 2018-12-08 MED ORDER — INSULIN ASPART 100 UNIT/ML ~~LOC~~ SOLN
0.0000 [IU] | Freq: Every day | SUBCUTANEOUS | Status: DC
  Administered 2018-12-08: 4 [IU] via SUBCUTANEOUS
  Administered 2018-12-10 – 2018-12-11 (×2): 2 [IU] via SUBCUTANEOUS

## 2018-12-08 MED ORDER — GUAIFENESIN ER 600 MG PO TB12
600.0000 mg | ORAL_TABLET | Freq: Two times a day (BID) | ORAL | Status: DC
  Administered 2018-12-08 – 2018-12-12 (×9): 600 mg via ORAL
  Filled 2018-12-08 (×9): qty 1

## 2018-12-08 MED ORDER — DOXYCYCLINE HYCLATE 100 MG PO TABS
100.0000 mg | ORAL_TABLET | Freq: Two times a day (BID) | ORAL | Status: DC
  Administered 2018-12-08 – 2018-12-12 (×9): 100 mg via ORAL
  Filled 2018-12-08 (×10): qty 1

## 2018-12-08 MED ORDER — INSULIN ASPART 100 UNIT/ML ~~LOC~~ SOLN
0.0000 [IU] | Freq: Three times a day (TID) | SUBCUTANEOUS | Status: DC
  Administered 2018-12-08 (×2): 20 [IU] via SUBCUTANEOUS
  Administered 2018-12-09: 11 [IU] via SUBCUTANEOUS
  Administered 2018-12-09 (×2): 15 [IU] via SUBCUTANEOUS
  Administered 2018-12-09: 20 [IU] via SUBCUTANEOUS

## 2018-12-08 MED ORDER — DEXTROMETHORPHAN POLISTIREX ER 30 MG/5ML PO SUER
15.0000 mg | Freq: Two times a day (BID) | ORAL | Status: DC | PRN
  Administered 2018-12-08 – 2018-12-09 (×3): 15 mg via ORAL
  Filled 2018-12-08 (×5): qty 5

## 2018-12-08 NOTE — Progress Notes (Signed)
[facility] Progress Note  Patient: Tracy DerbyLisa Riddle WUJ:[id number]   PCP: Camie PatienceHayes, Romana S, FNP DOB: September 13, 1971   DOA: 12/07/2018   DOS: 12/08/2018   Date of Service: the patient was seen and examined on 12/08/2018  Brief hospital course: Pt. with PMH of MS; GERD; DM; and asthma; admitted on 12/07/2018, presented with complaint of shortness of breath, was found to have COPD exacerbation. Currently further plan is continue current measures.  Subjective: Continues to have severe shortness of breath as well as cough.  Unable to sleep at night secondary to cough.  No nausea no vomiting.  Difficulty eating secondary to cough.  Assessment and Plan: Acute respiratory failure with hypoxia -Patient presented with acute respiratory failure requiring BIPAP -She was treated for sepsis with Cefepime and Vanc -still in severe distress  Sepsis POA -SIRS criteria in this patient includes: Fever, tachycardia, tachypnea  -Patient has NO evidence of acute organ failure at this time -While awaiting blood cultures, this may be a preseptic condition. -Sepsis protocol initiated -Blood and urine cultures pending - add doxycycline  Asthma exacerbation -She has history of severe asthma without history of intubation but with multiple hospital admissions including 11/16-22. -Morbid obesity, intermittent tobacco use are likely exacerbating factors. -Nebulizers: prn albuterol and standing Duonebs -Solu-Medrol 60 mg IV BID  -Continue home Arnuity Ellipta (substituted Pulmicort per formulary and Breo Ellipta  Type 2 diabetes mellitus, uncontrolled with hyperglycemia -A1c was 11.7 on 11/17 -Hold Vitoza, Glucophage -Continue Lantus -Cover with resistance-scale SSI  History of seizures. -Patient with reported h/o seizures as well as psychogenic non-epileptic seizures (pseudoseizures) -Also with depression/anxiety -Reported h/o MS but the only MRI of the brain I find is from 10/17 and did not demonstrate  apparent demyelination -Note from neurology on 02/10/17 does not indicate a diagnosis of MS -Continue home meds: Elavil, Cymbalta, Keppra, Lyrica  HTN -Continue Coreg  HLD -Continue Lipitor  Diet: cardiac diet carb modified DVT Prophylaxis: subcutaneous Heparin  Advance goals of care discussion: ful code  Family Communication: no family was present at bedside, at the time of interview.   Disposition:  Discharge to home.  Consultants: none Procedures: noen  Scheduled Meds: . amitriptyline  30 mg Oral QHS  . atorvastatin  20 mg Oral QHS  . benzonatate  100 mg Oral TID  . budesonide  0.25 mg Nebulization BID  . carvedilol  6.25 mg Oral BID WC  . dicyclomine  10 mg Oral BID  . docusate sodium  100 mg Oral BID  . doxycycline  100 mg Oral Q12H  . DULoxetine  60 mg Oral Daily  . enoxaparin (LOVENOX) injection  40 mg Subcutaneous Q24H  . fluticasone furoate-vilanterol  1 puff Inhalation Daily  . guaiFENesin  600 mg Oral BID  . insulin aspart  0-20 Units Subcutaneous TID WC  . insulin aspart  0-5 Units Subcutaneous QHS  . insulin glargine  45 Units Subcutaneous Daily  . ipratropium-albuterol  3 mL Nebulization Q6H  . iron polysaccharides  150 mg Oral Daily  . levETIRAcetam  1,000 mg Oral BID  . methylPREDNISolone (SOLU-MEDROL) injection  60 mg Intravenous Q12H  . pantoprazole  40 mg Oral Daily  . pregabalin  300 mg Oral BID   Continuous Infusions: PRN Meds: acetaminophen **OR** acetaminophen, albuterol, dextromethorphan, ondansetron **OR** ondansetron (ZOFRAN) IV Antibiotics: Anti-infectives (From admission, onward)   Start     Dose/Rate Route Frequency Ordered Stop   12/08/18 1100  doxycycline (VIBRA-TABS) tablet 100 mg     100 mg  Oral Every 12 hours 12/08/18 1008 12/13/18 0959   12/07/18 0600  vancomycin (VANCOCIN) IVPB 1000 mg/200 mL premix  Status:  Discontinued     1,000 mg 200 mL/hr over 60 Minutes Intravenous  Once 12/07/18 0546 12/07/18 0550   12/07/18 0600   ceFEPIme (MAXIPIME) 2 g in sodium chloride 0.9 % 100 mL IVPB     2 g 200 mL/hr over 30 Minutes Intravenous  Once 12/07/18 0546 12/07/18 0632   12/07/18 0600  vancomycin (VANCOCIN) 1,500 mg in sodium chloride 0.9 % 500 mL IVPB     1,500 mg 250 mL/hr over 120 Minutes Intravenous  Once 12/07/18 0550 12/07/18 0815       Objective: Physical Exam: Vitals:   12/08/18 1547 12/08/18 1718 12/08/18 1722 12/08/18 2013  BP: 112/68 137/77    Pulse:  93    Resp: (!) 32  15   Temp: 98.1 F (36.7 C) 98.1 F (36.7 C)    TempSrc: Oral Oral    SpO2: 100% 95%  94%  Weight:      Height:        Intake/Output Summary (Last 24 hours) at 12/08/2018 2146 Last data filed at 12/08/2018 1900 Gross per 24 hour  Intake 350 ml  Output 100 ml  Net 250 ml   Filed Weights   12/07/18 0538 12/07/18 1122  Weight: 86.2 kg 86.2 kg   General: Alert, Awake and Oriented to Time, Place and Person. Appear in marked distress, affect appropriate Eyes: PERRL, Conjunctiva normal ENT: Oral Mucosa clear moist. Neck: no JVD, no Abnormal Mass Or lumps Cardiovascular: S1 and S2 Present, no Murmur, Peripheral Pulses Present Respiratory: increased respiratory effort, Bilateral Air entry equal and Decreased, no use of accessory muscle, bilateral  Crackles, no wheezes Abdomen: Bowel Sound present, Soft and no tenderness, no hernia Skin: no redness, non Rash, on induration Extremities: no Pedal edema, no calf tenderness Neurologic: Grossly no focal neuro deficit. Bilaterally Equal motor strength  Data Reviewed: CBC: Recent Labs  Lab 12/07/18 0550 12/08/18 0251  WBC 7.6 8.4  NEUTROABS 6.4  --   HGB 9.9* 9.3*  HCT 34.2* 31.1*  MCV 76.9* 74.8*  PLT 200 207   Basic Metabolic Panel: Recent Labs  Lab 12/07/18 0550 12/08/18 0251  NA 139 139  K 4.4 4.5  CL 102 103  CO2 29 27  GLUCOSE 142* 352*  BUN 15 19  CREATININE 0.79 0.73  CALCIUM 8.7* 8.8*    Liver Function Tests: Recent Labs  Lab 12/07/18 0550  AST  26  ALT 30  ALKPHOS 96  BILITOT 0.6  PROT 5.9*  ALBUMIN 3.4*   No results for input(s): LIPASE, AMYLASE in the last 168 hours. No results for input(s): AMMONIA in the last 168 hours. Coagulation Profile: No results for input(s): INR, PROTIME in the last 168 hours. Cardiac Enzymes: No results for input(s): CKTOTAL, CKMB, CKMBINDEX, TROPONINI in the last 168 hours. BNP (last 3 results) No results for input(s): PROBNP in the last 8760 hours. CBG: Recent Labs  Lab 12/07/18 2101 12/08/18 0748 12/08/18 1202 12/08/18 1716 12/08/18 2119  GLUCAP 326* 331* 387* 402* 348*   Studies: No results found.   Time spent: 35 minutes  Author: Lynden Oxford, MD [facility] Pager: [phone] 12/08/2018 9:46 PM  Between 7PM-7AM, please contact night-coverage at [email or web address], password [facility]

## 2018-12-08 NOTE — Progress Notes (Signed)
Inpatient Diabetes Program Recommendations  AACE/ADA: New Consensus Statement on Inpatient Glycemic Control (2015)  Target Ranges:  Prepandial:   less than 140 mg/dL      Peak postprandial:   less than 180 mg/dL (1-2 hours)      Critically ill patients:  140 - 180 mg/dL   Lab Results  Component Value Date   GLUCAP 331 (H) 12/08/2018   HGBA1C 8.6 (H) 12/07/2018    Review of Glycemic Control Results for Tracy Riddle, Melisssa (MRN [id number]) as of 12/08/2018 09:20  Ref. Range 12/07/2018 12:17 12/07/2018 17:06 12/07/2018 21:01 12/08/2018 07:48  Glucose-Capillary Latest Ref Range: 70 - 99 mg/dL 409164 (H) 811252 (H) 914326 (H) 331 (H)   Diabetes history: DM2 Outpatient Diabetes medications: Lantus 45 units + Novolog 8 units tid meal coverage + Victoza 1.2 mg + Metformin 1 gm bid Current orders for Inpatient glycemic control: Lantus 45 units + Novolog moderate correction tid  Inpatient Diabetes Program Recommendations:   -Add Novolog 5 units tid meal coverage if eats 50%  Thank you, Darel HongJudy E. Raejean Swinford, RN, MSN, CDE  Diabetes Coordinator Inpatient Glycemic Control Team Team Pager [phone] (8am-5pm) 12/08/2018 9:22 AM

## 2018-12-09 LAB — GLUCOSE, RANDOM: GLUCOSE: 471 mg/dL — AB (ref 70–99)

## 2018-12-09 LAB — GLUCOSE, CAPILLARY
Glucose-Capillary: 334 mg/dL — ABNORMAL HIGH (ref 70–99)
Glucose-Capillary: 433 mg/dL — ABNORMAL HIGH (ref 70–99)
Glucose-Capillary: 330 mg/dL — ABNORMAL HIGH (ref 70–99)
Glucose-Capillary: 281 mg/dL — ABNORMAL HIGH (ref 70–99)
Glucose-Capillary: 199 mg/dL — ABNORMAL HIGH (ref 70–99)

## 2018-12-09 MED ORDER — INSULIN GLARGINE 100 UNIT/ML ~~LOC~~ SOLN
5.0000 [IU] | Freq: Once | SUBCUTANEOUS | Status: AC
  Administered 2018-12-09: 5 [IU] via SUBCUTANEOUS
  Filled 2018-12-09: qty 0.05

## 2018-12-09 MED ORDER — FUROSEMIDE 20 MG PO TABS
20.0000 mg | ORAL_TABLET | Freq: Every day | ORAL | Status: DC
  Administered 2018-12-09 – 2018-12-12 (×4): 20 mg via ORAL
  Filled 2018-12-09 (×4): qty 1

## 2018-12-09 MED ORDER — IPRATROPIUM-ALBUTEROL 0.5-2.5 (3) MG/3ML IN SOLN
3.0000 mL | Freq: Four times a day (QID) | RESPIRATORY_TRACT | Status: DC
  Administered 2018-12-09 – 2018-12-10 (×5): 3 mL via RESPIRATORY_TRACT
  Filled 2018-12-09 (×5): qty 3

## 2018-12-09 MED ORDER — INSULIN ASPART 100 UNIT/ML ~~LOC~~ SOLN
12.0000 [IU] | Freq: Three times a day (TID) | SUBCUTANEOUS | Status: DC
  Administered 2018-12-09: 12 [IU] via SUBCUTANEOUS

## 2018-12-09 MED ORDER — INSULIN ASPART 100 UNIT/ML ~~LOC~~ SOLN: 12.0000 [IU] | Freq: Three times a day (TID) | SUBCUTANEOUS | Status: DC

## 2018-12-09 MED ORDER — INSULIN ASPART 100 UNIT/ML ~~LOC~~ SOLN
8.0000 [IU] | Freq: Three times a day (TID) | SUBCUTANEOUS | Status: DC
  Administered 2018-12-09: 8 [IU] via SUBCUTANEOUS

## 2018-12-09 MED ORDER — INSULIN GLARGINE 100 UNIT/ML ~~LOC~~ SOLN
50.0000 [IU] | Freq: Every day | SUBCUTANEOUS | Status: DC
  Administered 2018-12-10 – 2018-12-12 (×3): 50 [IU] via SUBCUTANEOUS
  Filled 2018-12-09 (×3): qty 0.5

## 2018-12-09 MED ORDER — HYDROCOD POLST-CPM POLST ER 10-8 MG/5ML PO SUER
5.0000 mL | Freq: Every day | ORAL | Status: DC
  Administered 2018-12-09 – 2018-12-11 (×3): 5 mL via ORAL
  Filled 2018-12-09 (×3): qty 5

## 2018-12-09 MED ORDER — METHYLPREDNISOLONE SODIUM SUCC 40 MG IJ SOLR
40.0000 mg | Freq: Two times a day (BID) | INTRAMUSCULAR | Status: DC
  Administered 2018-12-09 – 2018-12-12 (×7): 40 mg via INTRAVENOUS
  Filled 2018-12-09 (×7): qty 1

## 2018-12-09 NOTE — Progress Notes (Signed)
Inpatient Diabetes Program Recommendations  AACE/ADA: New Consensus Statement on Inpatient Glycemic Control (2015)  Target Ranges:  Prepandial:   less than 140 mg/dL      Peak postprandial:   less than 180 mg/dL (1-2 hours)      Critically ill patients:  140 - 180 mg/dL   Results for BLAYRE, BUCHKO (MRN [id number]) as of 12/09/2018 07:39  Ref. Range 12/07/2018 05:50  Hemoglobin A1C Latest Ref Range: 4.8 - 5.6 % 8.6 (H)   Results for RAYNAH, GOLDBERG (MRN [id number]) as of 12/09/2018 07:39  Ref. Range 12/07/2018 12:17 12/07/2018 17:06 12/07/2018 21:01  Glucose-Capillary Latest Ref Range: 70 - 99 mg/dL 208 (H)  45 units LANTUS 252 (H)  8 units NOVOLOG  326 (H)   Results for CAMARON, DEBERNARDI (MRN [id number]) as of 12/09/2018 07:39  Ref. Range 12/08/2018 07:48 12/08/2018 12:02 12/08/2018 17:16 12/08/2018 21:19  Glucose-Capillary Latest Ref Range: 70 - 99 mg/dL 747 (H)  11 units NOVOLOG +  45 units LANTUS  387 (H)  35 units NOVOLOG  402 (H)  20 units NOVOLOG  348 (H)  4 units NOVOLOG      Home DM Meds: Lantus 45 units Daily       Novolog 8 units TID with meals       Victoza 1.2 mg Daily       Metformin 1000 mg BID  Current Orders: Novolog Resistant Correction Scale/ SSI (0-20 units) TID AC + HS      Lantus 45 units Daily     Getting Solumedrol 60 mg BID.  CBGs elevated likely due to steroids.    MD- Please consider the following in-hospital insulin adjustments while patient remains on IV Steroids:  1. Increase Lantus to 50 units Daily--If dose already given this AM please consider ordering an extra 5 units Lantus X 1 dose this AM  2. Start Novolog Meal Coverage: Novolog 8 units TID with meals (home dose)  (Please add the following Hold Parameters: Hold if pt eats <50% of meal, Hold if pt NPO)     --Will follow patient during hospitalization--  Ambrose Finland RN, MSN, CDE Diabetes Coordinator Inpatient Glycemic Control Team Team Pager: [phone] (8a-5p)

## 2018-12-09 NOTE — Progress Notes (Signed)
[facility] Progress Note  Patient: Tracy Riddle ION:[id number]   PCP: Camie Patience, FNP DOB: Nov 16, 1971   DOA: 12/07/2018   DOS: 12/09/2018   Date of Service: the patient was seen and examined on 12/09/2018  Brief hospital course: Pt. with PMH of MS; GERD; DM; and asthma; admitted on 12/07/2018, presented with complaint of shortness of breath, was found to have COPD exacerbation. Currently further plan is continue current measures.  Subjective: Needed BiPAP last night due to shortness of breath.  Also had significant cough.  No nausea no vomiting no fever no chills.  Assessment and Plan: Acute respiratory failure with hypoxia -Patient presented with acute respiratory failure requiring BIPAP -She was treated for sepsis with Cefepime and Vanc -still in severe distress  Sepsis POA -SIRS criteria in this patient includes: Fever, tachycardia, tachypnea  -Patient has NO evidence of acute organ failure at this time -While awaiting blood cultures, this may be a preseptic condition. -Sepsis protocol initiated -Blood and urine cultures pending - add doxycycline  Asthma exacerbation -She has history of severe asthma without history of intubation but with multiple hospital admissions including 11/16-22. -Morbid obesity, intermittent tobacco use are likely exacerbating factors. -Nebulizers: prn albuterol and standing Duonebs -Solu-Medrol 60 mg IV BID  -Continue home Arnuity Ellipta (substituted Pulmicort per formulary and Breo Ellipta  Type 2 diabetes mellitus, uncontrolled with hyperglycemia -A1c was 11.7 on 11/17 -Hold Vitoza, Glucophage -Continue Lantus -Cover with resistance-scale SSI  History of seizures. -Patient with reported h/o seizures as well as psychogenic non-epileptic seizures (pseudoseizures) -Also with depression/anxiety -Reported h/o MS but the only MRI of the brain I find is from 10/17 and did not demonstrate apparent demyelination -Note from neurology on  02/10/17 does not indicate a diagnosis of MS -Continue home meds: Elavil, Cymbalta, Keppra, Lyrica  HTN -Continue Coreg  HLD -Continue Lipitor  Diet: cardiac diet carb modified DVT Prophylaxis: subcutaneous Heparin  Advance goals of care discussion: ful code  Family Communication: no family was present at bedside, at the time of interview.   Disposition:  Discharge to [facility].  Consultants: none Procedures: noen  Scheduled Meds: . amitriptyline  30 mg Oral QHS  . atorvastatin  20 mg Oral QHS  . benzonatate  100 mg Oral TID  . budesonide  0.25 mg Nebulization BID  . carvedilol  6.25 mg Oral BID WC  . chlorpheniramine-HYDROcodone  5 mL Oral QHS  . dicyclomine  10 mg Oral BID  . docusate sodium  100 mg Oral BID  . doxycycline  100 mg Oral Q12H  . DULoxetine  60 mg Oral Daily  . enoxaparin (LOVENOX) injection  40 mg Subcutaneous Q24H  . fluticasone furoate-vilanterol  1 puff Inhalation Daily  . furosemide  20 mg Oral Daily  . guaiFENesin  600 mg Oral BID  . insulin aspart  0-20 Units Subcutaneous TID WC  . insulin aspart  0-5 Units Subcutaneous QHS  . insulin aspart  12 Units Subcutaneous TID WC  . insulin glargine  45 Units Subcutaneous Daily  . ipratropium-albuterol  3 mL Nebulization QID  . iron polysaccharides  150 mg Oral Daily  . levETIRAcetam  1,000 mg Oral BID  . methylPREDNISolone (SOLU-MEDROL) injection  40 mg Intravenous Q12H  . pantoprazole  40 mg Oral Daily  . pregabalin  300 mg Oral BID   Continuous Infusions: PRN Meds: acetaminophen **OR** acetaminophen, albuterol, dextromethorphan, ondansetron **OR** ondansetron (ZOFRAN) IV Antibiotics: Anti-infectives (From admission, onward)   Start     Dose/Rate Route  Frequency Ordered Stop   12/08/18 1100  doxycycline (VIBRA-TABS) tablet 100 mg     100 mg Oral Every 12 hours 12/08/18 1008 12/13/18 0959   12/07/18 0600  vancomycin (VANCOCIN) IVPB 1000 mg/200 mL premix  Status:  Discontinued     1,000  mg 200 mL/hr over 60 Minutes Intravenous  Once 12/07/18 0546 12/07/18 0550   12/07/18 0600  ceFEPIme (MAXIPIME) 2 g in sodium chloride 0.9 % 100 mL IVPB     2 g 200 mL/hr over 30 Minutes Intravenous  Once 12/07/18 0546 12/07/18 0632   12/07/18 0600  vancomycin (VANCOCIN) 1,500 mg in sodium chloride 0.9 % 500 mL IVPB     1,500 mg 250 mL/hr over 120 Minutes Intravenous  Once 12/07/18 0550 12/07/18 0815       Objective: Physical Exam: Vitals:   12/09/18 0802 12/09/18 0806 12/09/18 0812 12/09/18 1110  BP:   (!) 164/93   Pulse:   97   Resp:   13   Temp:   97.7 F (36.5 C)   TempSrc:   Oral   SpO2: 97% 97% 94% 98%  Weight:      Height:        Intake/Output Summary (Last 24 hours) at 12/09/2018 1425 Last data filed at 12/09/2018 1000 Gross per 24 hour  Intake 440 ml  Output 50 ml  Net 390 ml   Filed Weights   12/07/18 0538 12/07/18 1122  Weight: 86.2 kg 86.2 kg   General: Alert, Awake and Oriented to Time, Place and Person. Appear in marked distress, affect appropriate Eyes: PERRL, Conjunctiva normal ENT: Oral Mucosa clear moist. Neck: no JVD, no Abnormal Mass Or lumps Cardiovascular: S1 and S2 Present, no Murmur, Peripheral Pulses Present Respiratory: increased respiratory effort, Bilateral Air entry equal and Decreased, no use of accessory muscle, bilateral  Crackles, no wheezes Abdomen: Bowel Sound present, Soft and no tenderness, no hernia Skin: no redness, non Rash, on induration Extremities: no Pedal edema, no calf tenderness Neurologic: Grossly no focal neuro deficit. Bilaterally Equal motor strength  Data Reviewed: CBC: Recent Labs  Lab 12/07/18 0550 12/08/18 0251  WBC 7.6 8.4  NEUTROABS 6.4  --   HGB 9.9* 9.3*  HCT 34.2* 31.1*  MCV 76.9* 74.8*  PLT 200 207   Basic Metabolic Panel: Recent Labs  Lab 12/07/18 0550 12/08/18 0251 12/09/18 1159  NA 139 139  --   K 4.4 4.5  --   CL 102 103  --   CO2 29 27  --   GLUCOSE 142* 352* 471*  BUN 15 19  --    CREATININE 0.79 0.73  --   CALCIUM 8.7* 8.8*  --     Liver Function Tests: Recent Labs  Lab 12/07/18 0550  AST 26  ALT 30  ALKPHOS 96  BILITOT 0.6  PROT 5.9*  ALBUMIN 3.4*   No results for input(s): LIPASE, AMYLASE in the last 168 hours. No results for input(s): AMMONIA in the last 168 hours. Coagulation Profile: No results for input(s): INR, PROTIME in the last 168 hours. Cardiac Enzymes: No results for input(s): CKTOTAL, CKMB, CKMBINDEX, TROPONINI in the last 168 hours. BNP (last 3 results) No results for input(s): PROBNP in the last 8760 hours. CBG: Recent Labs  Lab 12/08/18 1202 12/08/18 1716 12/08/18 2119 12/09/18 0818 12/09/18 1149  GLUCAP 387* 402* 348* 334* 433*   Studies: No results found.   Time spent: 35 minutes  Author: Lynden OxfordPranav Damesha Lawler, MD [facility] Pager: [phone] 12/09/2018 2:25  PM  Between 7PM-7AM, please contact night-coverage at [email or web address], password [facility]

## 2018-12-10 LAB — BASIC METABOLIC PANEL
Anion gap: 10 (ref 5–15)
BUN: 21 mg/dL — ABNORMAL HIGH (ref 6–20)
CALCIUM: 9.2 mg/dL (ref 8.9–10.3)
CO2: 30 mmol/L (ref 22–32)
Chloride: 99 mmol/L (ref 98–111)
Creatinine, Ser: 0.69 mg/dL (ref 0.44–1.00)
GFR calc Af Amer: >60 mL/min (ref >60)
GFR calc non Af Amer: >60 mL/min (ref >60)
Glucose, Bld: 151 mg/dL — ABNORMAL HIGH (ref 70–99)
Potassium: 4.1 mmol/L (ref 3.5–5.1)
Sodium: 139 mmol/L (ref 135–145)

## 2018-12-10 LAB — GLUCOSE, CAPILLARY
GLUCOSE-CAPILLARY: 145 mg/dL — AB (ref 70–99)
Glucose-Capillary: 165 mg/dL — ABNORMAL HIGH (ref 70–99)
Glucose-Capillary: 301 mg/dL — ABNORMAL HIGH (ref 70–99)
Glucose-Capillary: 235 mg/dL — ABNORMAL HIGH (ref 70–99)

## 2018-12-10 LAB — CBC
HCT: 31.8 % — ABNORMAL LOW (ref 36.0–46.0)
Hemoglobin: 9.4 g/dL — ABNORMAL LOW (ref 12.0–15.0)
MCH: 22.1 pg — AB (ref 26.0–34.0)
MCHC: 29.6 g/dL — ABNORMAL LOW (ref 30.0–36.0)
MCV: 74.6 fL — AB (ref 80.0–100.0)
Platelets: 249 10*3/uL (ref 150–400)
RBC: 4.26 MIL/uL (ref 3.87–5.11)
RDW: 20.6 % — ABNORMAL HIGH (ref 11.5–15.5)
WBC: 10.2 10*3/uL (ref 4.0–10.5)
nRBC: 0 % (ref 0.0–0.2)

## 2018-12-10 LAB — MAGNESIUM: Magnesium: 1.4 mg/dL — ABNORMAL LOW (ref 1.7–2.4)

## 2018-12-10 MED ORDER — INSULIN ASPART 100 UNIT/ML ~~LOC~~ SOLN
0.0000 [IU] | Freq: Three times a day (TID) | SUBCUTANEOUS | Status: DC
  Administered 2018-12-10: 4 [IU] via SUBCUTANEOUS
  Administered 2018-12-10: 15 [IU] via SUBCUTANEOUS
  Administered 2018-12-10 – 2018-12-11 (×2): 3 [IU] via SUBCUTANEOUS
  Administered 2018-12-11: 4 [IU] via SUBCUTANEOUS
  Administered 2018-12-11: 11 [IU] via SUBCUTANEOUS
  Administered 2018-12-12: 7 [IU] via SUBCUTANEOUS
  Administered 2018-12-12 (×2): 3 [IU] via SUBCUTANEOUS

## 2018-12-10 MED ORDER — IPRATROPIUM-ALBUTEROL 0.5-2.5 (3) MG/3ML IN SOLN
3.0000 mL | Freq: Two times a day (BID) | RESPIRATORY_TRACT | Status: DC
  Administered 2018-12-10 – 2018-12-12 (×4): 3 mL via RESPIRATORY_TRACT
  Filled 2018-12-10 (×5): qty 3

## 2018-12-10 MED ORDER — MAGNESIUM SULFATE 2 GM/50ML IV SOLN
2.0000 g | Freq: Once | INTRAVENOUS | Status: AC
  Administered 2018-12-10: 2 g via INTRAVENOUS
  Filled 2018-12-10: qty 50

## 2018-12-10 MED ORDER — INSULIN ASPART 100 UNIT/ML ~~LOC~~ SOLN
12.0000 [IU] | Freq: Three times a day (TID) | SUBCUTANEOUS | Status: DC
  Administered 2018-12-10 – 2018-12-12 (×9): 12 [IU] via SUBCUTANEOUS

## 2018-12-10 NOTE — Progress Notes (Signed)
[facility] Progress Note  Patient: Tracy Riddle BJY:[id number]   PCP: Camie Patience, FNP DOB: 02/10/1971   DOA: 12/07/2018   DOS: 12/10/2018   Date of Service: the patient was seen and examined on 12/10/2018  Brief hospital course: Pt. with PMH of MS; GERD; DM; and asthma; admitted on 12/07/2018, presented with complaint of shortness of breath, was found to have COPD exacerbation. Currently further plan is continue current measures.  Subjective: feeling better, no fever or chills, slept without bipap last night.   Assessment and Plan: Acute respiratory failure with hypoxia -Patient presented with acute respiratory failure requiring BIPAP -She was treated for sepsis with Cefepime and Vanc -Needed bipap on admission now on room air.   Sepsis POA -SIRS criteria in this patient includes: Fever, tachycardia, tachypnea  -Patient has NO evidence of acute organ failure at this time -While awaiting blood cultures, this may be a preseptic condition. -Sepsis protocol initiated -Blood and urine cultures not shows any growth.  -add doxycycline  Asthma exacerbation -She has history of severe asthma without history of intubation but with multiple hospital admissions including 11/16-22. -Morbid obesity, intermittent tobacco use are likely exacerbating factors. -Nebulizers: prn albuterol and standing Duonebs -Solu-Medrol 60 mg IV BID  -Continue home Arnuity Ellipta (substituted Pulmicort per formulary and Breo Ellipta  Type 2 diabetes mellitus, uncontrolled with hyperglycemia -A1c was 11.7 on 11/17 -Hold Vitoza, Glucophage -Continue Lantus -Cover with resistance-scale SSI  History of seizures. -Patient with reported h/o seizures as well as psychogenic non-epileptic seizures (pseudoseizures) -Also with depression/anxiety -Reported h/o MS but the only MRI of the brain I find is from 10/17 and did not demonstrate apparent demyelination -Note from neurology on 02/10/17 does not  indicate a diagnosis of MS -Continue home meds: Elavil, Cymbalta, Keppra, Lyrica  HTN -Continue Coreg  HLD -Continue Lipitor  Diet: cardiac diet carb modified DVT Prophylaxis: subcutaneous Heparin  Advance goals of care discussion: ful code  Family Communication: no family was present at bedside, at the time of interview.   Disposition:  Discharge to [facility].  Consultants: none Procedures: noen  Scheduled Meds: . amitriptyline  30 mg Oral QHS  . atorvastatin  20 mg Oral QHS  . benzonatate  100 mg Oral TID  . budesonide  0.25 mg Nebulization BID  . carvedilol  6.25 mg Oral BID WC  . chlorpheniramine-HYDROcodone  5 mL Oral QHS  . dicyclomine  10 mg Oral BID  . docusate sodium  100 mg Oral BID  . doxycycline  100 mg Oral Q12H  . DULoxetine  60 mg Oral Daily  . enoxaparin (LOVENOX) injection  40 mg Subcutaneous Q24H  . fluticasone furoate-vilanterol  1 puff Inhalation Daily  . furosemide  20 mg Oral Daily  . guaiFENesin  600 mg Oral BID  . insulin aspart  0-20 Units Subcutaneous TID WC  . insulin aspart  0-5 Units Subcutaneous QHS  . insulin aspart  12 Units Subcutaneous TID WC  . insulin glargine  50 Units Subcutaneous Daily  . ipratropium-albuterol  3 mL Nebulization BID  . iron polysaccharides  150 mg Oral Daily  . levETIRAcetam  1,000 mg Oral BID  . methylPREDNISolone (SOLU-MEDROL) injection  40 mg Intravenous Q12H  . pantoprazole  40 mg Oral Daily  . pregabalin  300 mg Oral BID   Continuous Infusions: PRN Meds: acetaminophen **OR** acetaminophen, albuterol, dextromethorphan, ondansetron **OR** ondansetron (ZOFRAN) IV Antibiotics: Anti-infectives (From admission, onward)   Start     Dose/Rate Route Frequency Ordered Stop  12/08/18 1100  doxycycline (VIBRA-TABS) tablet 100 mg     100 mg Oral Every 12 hours 12/08/18 1008 12/13/18 0959   12/07/18 0600  vancomycin (VANCOCIN) IVPB 1000 mg/200 mL premix  Status:  Discontinued     1,000 mg 200 mL/hr over 60  Minutes Intravenous  Once 12/07/18 0546 12/07/18 0550   12/07/18 0600  ceFEPIme (MAXIPIME) 2 g in sodium chloride 0.9 % 100 mL IVPB     2 g 200 mL/hr over 30 Minutes Intravenous  Once 12/07/18 0546 12/07/18 0632   12/07/18 0600  vancomycin (VANCOCIN) 1,500 mg in sodium chloride 0.9 % 500 mL IVPB     1,500 mg 250 mL/hr over 120 Minutes Intravenous  Once 12/07/18 0550 12/07/18 0815       Objective: Physical Exam: Vitals:   12/09/18 2106 12/09/18 2335 12/10/18 0814 12/10/18 0916  BP:  (!) 104/55 120/86   Pulse:  76 88   Resp:  (!) 22 13   Temp:  97.8 F (36.6 C) 97.8 F (36.6 C)   TempSrc:  Oral Oral   SpO2: 96% 95% 90% 91%  Weight:      Height:        Intake/Output Summary (Last 24 hours) at 12/10/2018 1517 Last data filed at 12/10/2018 0944 Gross per 24 hour  Intake 50 ml  Output -  Net 50 ml   Filed Weights   12/07/18 0538 12/07/18 1122  Weight: 86.2 kg 86.2 kg   General: Alert, Awake and Oriented to Time, Place and Person. Appear in marked distress, affect appropriate Eyes: PERRL, Conjunctiva normal ENT: Oral Mucosa clear moist. Neck: no JVD, no Abnormal Mass Or lumps Cardiovascular: S1 and S2 Present, no Murmur, Peripheral Pulses Present Respiratory: increased respiratory effort, Bilateral Air entry equal and Decreased, no use of accessory muscle, bilateral  Crackles, no wheezes Abdomen: Bowel Sound present, Soft and no tenderness, no hernia Skin: no redness, non Rash, on induration Extremities: no Pedal edema, no calf tenderness Neurologic: Grossly no focal neuro deficit. Bilaterally Equal motor strength  Data Reviewed: CBC: Recent Labs  Lab 12/07/18 0550 12/08/18 0251 12/10/18 0217  WBC 7.6 8.4 10.2  NEUTROABS 6.4  --   --   HGB 9.9* 9.3* 9.4*  HCT 34.2* 31.1* 31.8*  MCV 76.9* 74.8* 74.6*  PLT 200 207 249   Basic Metabolic Panel: Recent Labs  Lab 12/07/18 0550 12/08/18 0251 12/09/18 1159 12/10/18 0217  NA 139 139  --  139  K 4.4 4.5  --  4.1    CL 102 103  --  99  CO2 29 27  --  30  GLUCOSE 142* 352* 471* 151*  BUN 15 19  --  21*  CREATININE 0.79 0.73  --  0.69  CALCIUM 8.7* 8.8*  --  9.2  MG  --   --   --  1.4*    Liver Function Tests: Recent Labs  Lab 12/07/18 0550  AST 26  ALT 30  ALKPHOS 96  BILITOT 0.6  PROT 5.9*  ALBUMIN 3.4*   No results for input(s): LIPASE, AMYLASE in the last 168 hours. No results for input(s): AMMONIA in the last 168 hours. Coagulation Profile: No results for input(s): INR, PROTIME in the last 168 hours. Cardiac Enzymes: No results for input(s): CKTOTAL, CKMB, CKMBINDEX, TROPONINI in the last 168 hours. BNP (last 3 results) No results for input(s): PROBNP in the last 8760 hours. CBG: Recent Labs  Lab 12/09/18 1532 12/09/18 1700 12/09/18 2143 12/10/18 4098 12/10/18  1142  GLUCAP 330* 281* 199* 165* 301*   Studies: No results found.   Time spent: 35 minutes  Author: Lynden OxfordPranav Patel, MD [facility] Pager: [phone] 12/10/2018 3:17 PM  Between 7PM-7AM, please contact night-coverage at [email or web address], password [email or web address]

## 2018-12-10 NOTE — Progress Notes (Signed)
Refused Bipap 

## 2018-12-11 LAB — BASIC METABOLIC PANEL
ANION GAP: 9 (ref 5–15)
BUN: 22 mg/dL — ABNORMAL HIGH (ref 6–20)
CALCIUM: 9 mg/dL (ref 8.9–10.3)
CO2: 34 mmol/L — ABNORMAL HIGH (ref 22–32)
Chloride: 98 mmol/L (ref 98–111)
Creatinine, Ser: 0.88 mg/dL (ref 0.44–1.00)
GFR calc Af Amer: >60 mL/min (ref >60)
Glucose, Bld: 100 mg/dL — ABNORMAL HIGH (ref 70–99)
Potassium: 3.9 mmol/L (ref 3.5–5.1)
Sodium: 141 mmol/L (ref 135–145)

## 2018-12-11 LAB — CBC
HCT: 33.3 % — ABNORMAL LOW (ref 36.0–46.0)
Hemoglobin: 9.9 g/dL — ABNORMAL LOW (ref 12.0–15.0)
MCH: 22.3 pg — ABNORMAL LOW (ref 26.0–34.0)
MCHC: 29.7 g/dL — ABNORMAL LOW (ref 30.0–36.0)
MCV: 75.2 fL — ABNORMAL LOW (ref 80.0–100.0)
PLATELETS: 246 10*3/uL (ref 150–400)
RBC: 4.43 MIL/uL (ref 3.87–5.11)
RDW: 20.7 % — ABNORMAL HIGH (ref 11.5–15.5)
WBC: 8.1 10*3/uL (ref 4.0–10.5)
nRBC: 0 % (ref 0.0–0.2)

## 2018-12-11 LAB — GLUCOSE, CAPILLARY
GLUCOSE-CAPILLARY: 128 mg/dL — AB (ref 70–99)
Glucose-Capillary: 262 mg/dL — ABNORMAL HIGH (ref 70–99)
Glucose-Capillary: 196 mg/dL — ABNORMAL HIGH (ref 70–99)
Glucose-Capillary: 203 mg/dL — ABNORMAL HIGH (ref 70–99)

## 2018-12-11 MED ORDER — PREDNISONE 10 MG PO TABS: ORAL_TABLET | ORAL | 0 refills | Status: AC

## 2018-12-11 MED ORDER — GUAIFENESIN ER 600 MG PO TB12: 600.0000 mg | ORAL_TABLET | Freq: Two times a day (BID) | ORAL | 0 refills | Status: AC

## 2018-12-11 MED ORDER — DOCUSATE SODIUM 100 MG PO CAPS: 100.0000 mg | ORAL_CAPSULE | Freq: Two times a day (BID) | ORAL | 0 refills | Status: AC

## 2018-12-11 MED ORDER — DEXTROMETHORPHAN POLISTIREX ER 30 MG/5ML PO SUER: 15.0000 mg | Freq: Two times a day (BID) | ORAL | 0 refills | Status: AC | PRN

## 2018-12-11 MED ORDER — DOXYCYCLINE HYCLATE 100 MG PO TABS: 100.0000 mg | ORAL_TABLET | Freq: Two times a day (BID) | ORAL | 0 refills | Status: AC

## 2018-12-11 MED ORDER — BENZONATATE 100 MG PO CAPS: 100.0000 mg | ORAL_CAPSULE | Freq: Three times a day (TID) | ORAL | 0 refills | Status: AC

## 2018-12-11 NOTE — Progress Notes (Signed)
12/11/18:  [facility] stated [facility] is able to go home. CSW called and spoke with Dr. Allena KatzPatel. He will be putting in a physical therapy note to verify if the patient is able to return home or will continue to need SNF.   SW will continue to follow the patient.   Drucilla Schmidtaitlin Jeren Dufrane, MSW, LCSW-A Clinical Social Worker [facility]

## 2018-12-11 NOTE — Discharge Summary (Signed)
[facility] Discharge Summary   Patient: Tracy Riddle HWT:[id number]   PCP: Camie Patience, FNP DOB: [birth year]   Date of admission: 12/07/2018   Date of discharge:  12/11/2018    Discharge Diagnoses:  Principal Problem:   Acute respiratory failure with hypoxia (HCC) Active Problems:   Sepsis (HCC)   Asthma, chronic, unspecified asthma severity, with acute exacerbation   Diabetes mellitus type 2 in obese [facility])   Admitted From: [facility] Disposition: [facility]  Recommendations for Outpatient Follow-up:  1. Please follow-up with PCP in 1 week  Follow-up Information    Camie Patience, FNP. Schedule an appointment as soon as possible for a visit in 1 week(s).   Specialty:  Family Medicine Contact information: [address] [place] [address] [phone]          Diet recommendation: Cardiac diet carb modified  Activity: The patient is advised to gradually reintroduce usual activities.  Discharge Condition: good  Code Status: Full code  History of present illness: As per the H and P dictated on admission, "Tracy Riddle is a 47 y.o. female with medical history significant of MS; GERD; DM; and asthma presenting with  SOB.She started coughing and was unable to catch her breath.  The facility called 911; [facility] was on diversion and so she was brought to [facility]  Coughing started last night.  Nonproductive cough.  +fever.  +wheezing.  She has h/o asthma, no h/o intubation.  She got better with BIPAP and is feeling better, now off BIPAP.  She was feeling well prior to yesterday.  No sick contacts.  ED Course:  Respiratory distress - usually seen at [facility].  Fever, cough, no CP.  H/o asthma - given steroids and neb by EMS.  Clinically appears to be PNA, covered for HCAP. Lactate normal so not given full bolus (possibly volume, unlikely).  Had been hypoxic into the 70s.  May be able to come off BIPAP soon"  Hospital Course:  Summary of her active problems in the  hospital is as following. Acute respiratory failure with hypoxia -Patient presented with acute respiratory failure requiring BIPAP -She was treated for sepsis with Cefepime and Vanc -Needed bipap on admission now on room air.   Sepsis POA -SIRS criteria in this patient includes:Fever, tachycardia, tachypnea -Patient hasNOevidence of acute organ failure at this time -While awaiting blood cultures, thismaybe a preseptic condition. -Sepsis protocol initiated -Blood and urine cultures not shows any growth.   Viral panel is also negative. -add doxycycline  Asthma exacerbation -She has history of severe asthma without history of intubation but with multiple hospital admissionsincluding 11/16-22. -Morbid obesity, intermittent tobacco useare likely exacerbatingfactors. -Nebulizers: prn albuterol andstanding Duonebs -Solu-Medrol60 mg IV BID, change to oral prednisone for taper. -Continue home Arnuity Ellipta (substituted Pulmicort per formulary and Breo Ellipta  Type 2 diabetes mellitus, uncontrolled with hyperglycemia -A1c was 11.7 on 11/17 -Continue Vitoza, Glucophage -Continue Lantus -Cover with sliding-scale insulin  History of seizures. 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  Social worker was consulted for transfer back [facility]. On the day of the  discharge the patient's vitals are stable, and no other acute medical condition were reported by patient. the patient was felt safe to be discharge at [facility]  with therapy.  Consultants: none Procedures: noen  DISCHARGE MEDICATION: Allergies as of 12/11/2018      Reactions   Codeine Itching   Oxycodone Itching      Medication List    TAKE these medications   albuterol 108 (90 Base) MCG/ACT inhaler Commonly known as:  PROVENTIL HFA;VENTOLIN HFA Inhale 2 puffs into the lungs every 4 (four) hours as needed for wheezing or shortness of breath.   amitriptyline 10 MG tablet Commonly known as:  ELAVIL Take 30 mg by mouth at bedtime.   ARNUITY ELLIPTA 100 MCG/ACT Aepb Generic drug:  Fluticasone Furoate Inhale 1 puff into the lungs daily.   atorvastatin 20 MG tablet Commonly known as:  LIPITOR Take 20 mg by mouth at bedtime.   benzonatate 100 MG capsule Commonly known as:  TESSALON Take 1 capsule (100 mg total) by mouth 3 (three) times daily.   BREO ELLIPTA 100-25 MCG/INH Aepb Generic drug:  fluticasone furoate-vilanterol Inhale 1 puff into the lungs daily.   carvedilol 6.25 MG tablet Commonly known as:  COREG Take 6.25 mg by mouth 2 (two) times daily with a meal.   dextromethorphan 30 MG/5ML liquid Commonly known as:  DELSYM Take 2.5 mLs (15 mg total) by mouth 2 (two) times daily as needed for cough.   dicyclomine 10 MG capsule Commonly known as:  BENTYL Take 10 mg by mouth 2 (two) times daily.   docusate sodium 100 MG capsule Commonly known as:  COLACE Take 1 capsule (100 mg total) by mouth 2 (two) times daily.   doxycycline 100 MG tablet Commonly known as:  VIBRA-TABS Take 1 tablet (100 mg total) by mouth every 12 (twelve) hours for 2 days.   DULoxetine 60 MG capsule Commonly known as:  CYMBALTA Take 60 mg by mouth daily.   furosemide 20 MG tablet Commonly known as:  LASIX Take 20 mg by mouth as needed for edema.   guaiFENesin 600 MG 12 hr tablet Commonly known as:  MUCINEX Take 1 tablet (600 mg total) by mouth 2 (two) times daily.   insulin aspart 100 UNIT/ML injection Commonly known as:  novoLOG Inject 8  Units into the skin 3 (three) times daily before meals.   insulin glargine 100 UNIT/ML injection Commonly known as:  LANTUS Inject 45 Units into the skin daily.   ipratropium-albuterol 0.5-2.5 (3) MG/3ML Soln Commonly known as:  DUONEB Take 3 mLs by nebulization every 6 (six) hours as needed (for wheezing).   iron polysaccharides 150 MG capsule Commonly known as:  NIFEREX Take 150 mg by mouth daily.   levETIRAcetam 1000 MG tablet Commonly known as:  KEPPRA Take 1,000 mg by mouth 2 (two) times daily.   liraglutide 18 MG/3ML Sopn Commonly known as:  VICTOZA Inject 1.2 mg into the skin daily.   magnesium oxide 400 MG tablet Commonly known as:  MAG-OX Take 400 mg by mouth 2 (two) times daily.   meloxicam 15 MG tablet Commonly known as:  MOBIC Take 15 mg by mouth daily.   metFORMIN 1000 MG tablet Commonly known as:  GLUCOPHAGE Take 1,000 mg by mouth 2 (two) times daily with a meal.   METHEN-HYOSC-METH BLUE-NA PHOS PO Take 81.6 mg by mouth 4 (four) times daily.   pantoprazole 40 MG tablet Commonly known as:  PROTONIX Take 40 mg by mouth daily.  predniSONE 10 MG tablet Commonly known as:  DELTASONE Take 50mg  daily for 3days,Take 40mg  daily for 3days,Take 30mg  daily for 3days,Take 20mg  daily for 3days,Take 10mg  daily for 3days, then stop   pregabalin 150 MG capsule Commonly known as:  LYRICA Take 300 mg by mouth 2 (two) times daily.      Allergies  Allergen Reactions  . Codeine Itching  . Oxycodone Itching   Discharge Instructions    Diet - low sodium heart healthy   Complete by:  As directed    Discharge instructions   Complete by:  As directed    It is important that you read following instructions as well as go over your medication list with RN to help you understand your care after this hospitalization.  Discharge Instructions: Please follow-up with PCP in one week  Please request your primary care physician to go over all Hospital Tests and  Procedure/Radiological results at the follow up,  Please get all Hospital records sent to your PCP by signing hospital release before you go home.   Do not take more than prescribed Pain, Sleep and Anxiety Medications. You were cared for by a hospitalist during your hospital stay. If you have any questions about your discharge medications or the care you received while you were in the hospital after you are discharged, you can call the unit you were admitted to and ask to speak with the hospitalist on call if the hospitalist that took care of you is not available.  Once you are discharged, your primary care physician will handle any further medical issues. Please note that NO REFILLS for any discharge medications will be authorized once you are discharged, as it is imperative that you return to your primary care physician (or establish a relationship with a primary care physician if you do not have one) for your aftercare needs so that they can reassess your need for medications and monitor your lab values. You Must read complete instructions/literature along with all the possible adverse reactions/side effects for all the Medicines you take and that have been prescribed to you. Take any new Medicines after you have completely understood and accept all the possible adverse reactions/side effects. Wear Seat belts while driving. If you have smoked or chewed Tobacco in the last 2 yrs please stop smoking and/or stop any Recreational drug use.   Increase activity slowly   Complete by:  As directed      Discharge Exam: Filed Weights   12/07/18 0538 12/07/18 1122  Weight: 86.2 kg 86.2 kg   Vitals:   12/11/18 0740 12/11/18 0929  BP: (!) 155/95   Pulse: 87   Resp: 18   Temp: 97.6 F (36.4 C)   SpO2: 98% 98%   General: Appear in mild distress, no Rash; Oral Mucosa moist Cardiovascular: S1 and S2 Present, no Murmur, no JVD Respiratory: Bilateral Air entry present and Clear to Auscultation, no  Crackles, occ wheezes Abdomen: Bowel Sound present, Soft and no tenderness Extremities: no Pedal edema, no calf tenderness Neurology: Grossly no focal neuro deficit.  The results of significant diagnostics from this hospitalization (including imaging, microbiology, ancillary and laboratory) are listed below for reference.    Significant Diagnostic Studies: Dg Chest Portable 1 View  Result Date: 12/07/2018 CLINICAL DATA:  Shortness of breath EXAM: PORTABLE CHEST 1 VIEW COMPARISON:  Chest radiograph 11/07/2018 FINDINGS: Shallow lung inflation. Patchy bilateral opacities, right greater than left. No pneumothorax or sizable pleural effusion. IMPRESSION: Shallow lung inflation and patchy bilateral opacities,  favored to be pulmonary edema. Electronically Signed   By: Deatra RobinsonKevin  Herman M.D.   On: 12/07/2018 06:16    Microbiology: Recent Results (from the past 240 hour(s))  Blood Culture (routine x 2)     Status: None (Preliminary result)   Collection Time: 12/07/18  5:50 AM  Result Value Ref Range Status   Specimen Description BLOOD RIGHT HAND  Final   Special Requests   Final    BOTTLES DRAWN AEROBIC AND ANAEROBIC Blood Culture results may not be optimal due to an excessive volume of blood received in culture bottles   Culture   Final    NO GROWTH 4 DAYS Performed at [facility], [address] [place], [place] [address]    Report Status PENDING  Incomplete  Blood Culture (routine x 2)     Status: None (Preliminary result)   Collection Time: 12/07/18  5:54 AM  Result Value Ref Range Status   Specimen Description BLOOD LEFT HAND  Final   Special Requests   Final    BOTTLES DRAWN AEROBIC AND ANAEROBIC Blood Culture results may not be optimal due to an inadequate volume of blood received in culture bottles   Culture   Final    NO GROWTH 4 DAYS Performed at [facility], [address] [place], [place] [address]    Report Status PENDING  Incomplete  MRSA PCR Screening      Status: Abnormal   Collection Time: 12/07/18 11:52 AM  Result Value Ref Range Status   MRSA by PCR POSITIVE (A) NEGATIVE Final    Comment:        The GeneXpert MRSA Assay (FDA approved for NASAL specimens only), is one component of a comprehensive MRSA colonization surveillance program. It is not intended to diagnose MRSA infection nor to guide or monitor treatment for MRSA infections. RESULT CALLED TO, READ BACK BY AND VERIFIED WITH: Debby BudS. Coley RN 14:15 12/07/18 ([email or web address]) Performed at [facility], [address] [place], [place] [address]   Respiratory Panel by PCR     Status: None   Collection Time: 12/07/18 11:52 AM  Result Value Ref Range Status   Adenovirus NOT DETECTED NOT DETECTED Final   Coronavirus 229E NOT DETECTED NOT DETECTED Final   Coronavirus HKU1 NOT DETECTED NOT DETECTED Final   Coronavirus NL63 NOT DETECTED NOT DETECTED Final   Coronavirus OC43 NOT DETECTED NOT DETECTED Final   Metapneumovirus NOT DETECTED NOT DETECTED Final   Rhinovirus / Enterovirus NOT DETECTED NOT DETECTED Final   Influenza A NOT DETECTED NOT DETECTED Final   Influenza B NOT DETECTED NOT DETECTED Final   Parainfluenza Virus 1 NOT DETECTED NOT DETECTED Final   Parainfluenza Virus 2 NOT DETECTED NOT DETECTED Final   Parainfluenza Virus 3 NOT DETECTED NOT DETECTED Final   Parainfluenza Virus 4 NOT DETECTED NOT DETECTED Final   Respiratory Syncytial Virus NOT DETECTED NOT DETECTED Final   Bordetella pertussis NOT DETECTED NOT DETECTED Final   Chlamydophila pneumoniae NOT DETECTED NOT DETECTED Final   Mycoplasma pneumoniae NOT DETECTED NOT DETECTED Final    Comment: Performed at [facility], [address] [place], [place] [address]     Labs: CBC: Recent Labs  Lab 12/07/18 0550 12/08/18 0251 12/10/18 0217 12/11/18 0220  WBC 7.6 8.4 10.2 8.1  NEUTROABS 6.4  --   --   --   HGB 9.9* 9.3* 9.4* 9.9*  HCT 34.2* 31.1* 31.8* 33.3*  MCV 76.9* 74.8* 74.6* 75.2*  PLT 200 207  249 246   Basic Metabolic Panel: Recent Labs  Lab 12/07/18 0550 12/08/18 0251 12/09/18 1159 12/10/18 0217 12/11/18 0220  NA 139 139  --  139 141  K 4.4 4.5  --  4.1 3.9  CL 102 103  --  99 98  CO2 29 27  --  30 34*  GLUCOSE 142* 352* 471* 151* 100*  BUN 15 19  --  21* 22*  CREATININE 0.79 0.73  --  0.69 0.88  CALCIUM 8.7* 8.8*  --  9.2 9.0  MG  --   --   --  1.4*  --    Liver Function Tests: Recent Labs  Lab 12/07/18 0550  AST 26  ALT 30  ALKPHOS 96  BILITOT 0.6  PROT 5.9*  ALBUMIN 3.4*   No results for input(s): LIPASE, AMYLASE in the last 168 hours. No results for input(s): AMMONIA in the last 168 hours. Cardiac Enzymes: No results for input(s): CKTOTAL, CKMB, CKMBINDEX, TROPONINI in the last 168 hours. BNP (last 3 results) Recent Labs    12/07/18 0550  BNP 70.7   CBG: Recent Labs  Lab 12/10/18 0811 12/10/18 1142 12/10/18 1730 12/10/18 2134 12/11/18 0740  GLUCAP 165* 301* 145* 235* 128*   Time spent: 35 minutes  Signed:  Lynden Oxford  [facility]  12/11/2018  , 11:41 AM

## 2018-12-11 NOTE — Evaluation (Signed)
Physical Therapy Evaluation Patient Details Name: Tracy DerbyLisa Riddle MRN: [id number] DOB: 1971-12-11 Today's Date: 12/11/2018   History of Present Illness  Patient is a 47 y/o female who presents with cough, SOB and respiratory distress found to have COPD exacerbation and sepsis. PMH includes DM, MS, HTN, seizures.   Clinical Impression  Patient presents with generalized weakness, cardiopulmonary impairments and impaired mobility s/p above. Tolerated transfers and gait training with Min guard assist for balance/safety. Sp02 dropped to 86% on RA but recovered after a few mins of rest and cues for pursed lip breathing. Pt transferred from rehab in [place] and would like to return there to finish her therapy prior to going home. Still at risk for falls due to bil knee instability. However if pt's spouse able to provide 24.7 supervision for all mobility, may be safe to return home with HHPT. May need supplemental 02. Will follow acutely to maximize independence and mobility prior to discharge.     Follow Up Recommendations SNF;Supervision - Intermittent    Equipment Recommendations  None recommended by PT    Recommendations for Other Services       Precautions / Restrictions Precautions Precautions: Fall Restrictions Weight Bearing Restrictions: No      Mobility  Bed Mobility               General bed mobility comments: Sitting in chair upon PT arrival.   Transfers Overall transfer level: Needs assistance Equipment used: Rolling walker (2 wheeled) Transfers: Sit to/from Stand Sit to Stand: Min guard         General transfer comment: Min guard for safety. Stood from Orthoptistchair x1.   Ambulation/Gait Ambulation/Gait assistance: Min guard Gait Distance (Feet): 70 Feet Assistive device: Rolling walker (2 wheeled) Gait Pattern/deviations: Step-through pattern;Decreased stride length;Trunk flexed Gait velocity: decreased   General Gait Details: Slow, mildly unsteady gait with bil  knee instability/weakness. No evidence of buckling. 2/4 DOE. Sp02 dropped to 86% on RA. Recovered with cues for pursed lip breathing after a few mins.   Stairs            Wheelchair Mobility    Modified Rankin (Stroke Patients Only)       Balance Overall balance assessment: Mild deficits observed, not formally tested                                           Pertinent Vitals/Pain Pain Assessment: 0-10 Pain Score: 7  Pain Location: back Pain Descriptors / Indicators: Sore;Aching Pain Intervention(s): Monitored during session;Repositioned;Limited activity within patient's tolerance    Home Living Family/patient expects to be discharged to:: Skilled nursing facility Living Arrangements: Spouse/significant other Available Help at Discharge: Family;Available 24 hours/day Type of Home: Apartment Home Access: Level entry     Home Layout: One level Home Equipment: Wheelchair - [employer]manual;Walker - 2 wheels;Cane - single point      Prior Function Level of Independence: Needs assistance   Gait / Transfers Assistance Needed: Prior to going to rehab, pt walking household distances with RW or SPC. Uses w/c for longer distances.   ADL's / Homemaking Assistance Needed: Prior to rehab, doing own ADLs.   Comments: Recently at rehab in [place] and needing more assist than baseline.      Hand Dominance        Extremity/Trunk Assessment   Upper Extremity Assessment Upper Extremity Assessment: Defer to OT evaluation  Lower Extremity Assessment Lower Extremity Assessment: Generalized weakness(Sensation WFL.)       Communication   Communication: No difficulties  Cognition Arousal/Alertness: Awake/alert Behavior During Therapy: WFL for tasks assessed/performed Overall Cognitive Status: Within Functional Limits for tasks assessed                                        General Comments      Exercises     Assessment/Plan    PT  Assessment Patient needs continued PT services  PT Problem List Decreased strength;Decreased mobility;Pain;Decreased balance;Cardiopulmonary status limiting activity;Decreased activity tolerance;Obesity       PT Treatment Interventions Balance training;Patient/family education;Gait training;Therapeutic activities;Stair training;Therapeutic exercise;Functional mobility training    PT Goals (Current goals can be found in the Care Plan section)  Acute Rehab PT Goals Patient Stated Goal: to go back to rehab and finish before going home PT Goal Formulation: With patient Time For Goal Achievement: 12/25/18 Potential to Achieve Goals: Good    Frequency Min 3X/week   Barriers to discharge        Co-evaluation               AM-PAC PT "6 Clicks" Mobility  Outcome Measure Help needed turning from your back to your side while in a flat bed without using bedrails?: A Little Help needed moving from lying on your back to sitting on the side of a flat bed without using bedrails?: A Little Help needed moving to and from a bed to a chair (including a wheelchair)?: A Little Help needed standing up from a chair using your arms (e.g., wheelchair or bedside chair)?: A Little Help needed to walk in hospital room?: A Little Help needed climbing 3-5 steps with a railing? : A Lot 6 Click Score: 17    End of Session Equipment Utilized During Treatment: Gait belt Activity Tolerance: Patient limited by fatigue Patient left: in chair;with call bell/phone within reach Nurse Communication: Mobility status      Time: 4098-1191 PT Time Calculation (min) (ACUTE ONLY): 17 min   Charges:   PT Evaluation $PT Eval Moderate Complexity: 1 Mod          Mylo Red, PT, DPT Acute Rehabilitation Services Pager [phone] Office [phone]      Tracy Riddle 12/11/2018, 4:02 PM

## 2018-12-12 LAB — BASIC METABOLIC PANEL
Anion gap: 7 (ref 5–15)
BUN: 22 mg/dL — ABNORMAL HIGH (ref 6–20)
CO2: 33 mmol/L — ABNORMAL HIGH (ref 22–32)
Calcium: 9.2 mg/dL (ref 8.9–10.3)
Chloride: 100 mmol/L (ref 98–111)
Creatinine, Ser: 0.81 mg/dL (ref 0.44–1.00)
GFR calc Af Amer: >60 mL/min (ref >60)
GFR calc non Af Amer: >60 mL/min (ref >60)
Glucose, Bld: 165 mg/dL — ABNORMAL HIGH (ref 70–99)
Potassium: 4.3 mmol/L (ref 3.5–5.1)
SODIUM: 140 mmol/L (ref 135–145)

## 2018-12-12 LAB — CULTURE, BLOOD (ROUTINE X 2)
Culture: NO GROWTH
Culture: NO GROWTH

## 2018-12-12 LAB — CBC
HCT: 35 % — ABNORMAL LOW (ref 36.0–46.0)
Hemoglobin: 10.8 g/dL — ABNORMAL LOW (ref 12.0–15.0)
MCH: 23.1 pg — ABNORMAL LOW (ref 26.0–34.0)
MCHC: 30.9 g/dL (ref 30.0–36.0)
MCV: 74.9 fL — ABNORMAL LOW (ref 80.0–100.0)
Platelets: 298 10*3/uL (ref 150–400)
RBC: 4.67 MIL/uL (ref 3.87–5.11)
RDW: 20.6 % — ABNORMAL HIGH (ref 11.5–15.5)
WBC: 10.9 10*3/uL — AB (ref 4.0–10.5)
nRBC: 0 % (ref 0.0–0.2)

## 2018-12-12 LAB — GLUCOSE, CAPILLARY
GLUCOSE-CAPILLARY: 239 mg/dL — AB (ref 70–99)
GLUCOSE-CAPILLARY: 289 mg/dL — AB (ref 70–99)
Glucose-Capillary: 281 mg/dL — ABNORMAL HIGH (ref 70–99)

## 2018-12-12 NOTE — Progress Notes (Signed)
Inpatient Diabetes Program Recommendations  AACE/ADA: New Consensus Statement on Inpatient Glycemic Control (2019)  Target Ranges:  Prepandial:   less than 140 mg/dL      Peak postprandial:   less than 180 mg/dL (1-2 hours)      Critically ill patients:  140 - 180 mg/dL   Results for Tracy DerbyREED, Tracy (MRN [id number]) as of 12/12/2018 11:31  Ref. Range 12/11/2018 07:40 12/11/2018 12:01 12/11/2018 17:26 12/11/2018 21:17 12/12/2018 08:15  Glucose-Capillary Latest Ref Range: 70 - 99 mg/dL 409128 (H) 811262 (H) 914196 (H) 203 (H) 239 (H)   Review of Glycemic Control  Diabetes history: DM2 Outpatient Diabetes medications:Lantus 45 units daily, Novolog 8 units TID with meals, Victoza 1.2 mg daily, Metformin 1000 mg BID Current orders for Inpatient glycemic control: Lantus 50 units daily, Novolog 12 units TID with meals, Novolog 0-20 units TID with meals, Novolog 0-5 units QHS; Solumedrol 40 mg Q12H  Inpatient Diabetes Program Recommendations:   Insulin - Meal Coverage: If steroids are continued, please consider increasing meal coverage to Novolog 16 units TID with meals.  Thanks, Orlando PennerMarie Wynell Halberg, RN, MSN, CDE Diabetes Coordinator Inpatient Diabetes Program [phone] (Team Pager from 8am to 5pm)

## 2018-12-12 NOTE — Progress Notes (Signed)
Pt discharged per physician order to [facility] via transport with [facility]. Pt has personal belongings in her possession, nothing to/from Pharmacy or Security. Education completed, no questions at this time. Report given to Trula Oreheresa Townsend, LPN.

## 2018-12-12 NOTE — Clinical Social Work Note (Addendum)
Have called SNF admissions coordinator twice. Left one voicemail at 2:19. No call back yet.  Charlynn Court, CSW [phone]  4:08 pm SNF can take patient back today. MD aware.  Charlynn Court, CSW [phone]

## 2018-12-12 NOTE — [place] FL2 (Signed)
[place] MEDICAID FL2 LEVEL OF CARE SCREENING TOOL     IDENTIFICATION  Patient Name: Tracy Riddle Birthdate: 1971-11-11 Sex: female Admission Date (Current Location): 12/07/2018  [facility] and [place] Number:  Producer, television/film/video and Address:  The [place], [address], [place], [place] [address]      Provider Number: [id number]  Attending Physician Name and Address:  Rolly Salter, MD  Relative Name and Phone Number:       Current Level of Care: Hospital Recommended Level of Care: Skilled Nursing Facility Prior Approval Number:    Date Approved/Denied:   PASRR Number: [id number]  Discharge Plan: SNF    Current Diagnoses: Patient Active Problem List   Diagnosis Date Noted  . Acute respiratory failure with hypoxia (HCC) 12/07/2018  . Sepsis (HCC) 12/07/2018  . Asthma, chronic, unspecified asthma severity, with acute exacerbation 12/07/2018  . Diabetes mellitus type 2 in obese (HCC) 12/07/2018    Orientation RESPIRATION BLADDER Height & Weight     Self, Time, Situation, Place  Normal Continent Weight: 190 lb (86.2 kg) Height:  5\' 4"  (162.6 cm)  BEHAVIORAL SYMPTOMS/MOOD NEUROLOGICAL BOWEL NUTRITION STATUS  (None) (None) Continent Diet(Carb modified)  AMBULATORY STATUS COMMUNICATION OF NEEDS Skin   Limited Assist Verbally Normal                       Personal Care Assistance Level of Assistance              Functional Limitations Info  Sight, Hearing, Speech Sight Info: Adequate Hearing Info: Adequate Speech Info: Adequate    SPECIAL CARE FACTORS FREQUENCY  PT (By licensed PT)     PT Frequency: 5 x week              Contractures Contractures Info: Not present    Additional Factors Info  Code Status, Allergies, Isolation Precautions Code Status Info: Full code Allergies Info: Codeine, Oxycodone.     Isolation Precautions Info: Contact     Current Medications (12/12/2018):  This is the current hospital  active medication list Current Facility-Administered Medications  Medication Dose Route Frequency Provider Last Rate Last Dose  . acetaminophen (TYLENOL) tablet 650 mg  650 mg Oral Q6H PRN Jonah Blue, MD   650 mg at 12/11/18 2128   Or  . acetaminophen (TYLENOL) suppository 650 mg  650 mg Rectal Q6H PRN Jonah Blue, MD      . albuterol (PROVENTIL) (2.5 MG/3ML) 0.083% nebulizer solution 2.5 mg  2.5 mg Nebulization Q2H PRN Jonah Blue, MD      . amitriptyline (ELAVIL) tablet 30 mg  30 mg Oral Noemi Chapel, MD   30 mg at 12/11/18 2128  . atorvastatin (LIPITOR) tablet 20 mg  20 mg Oral QHS Jonah Blue, MD   20 mg at 12/11/18 2128  . benzonatate (TESSALON) capsule 100 mg  100 mg Oral TID Rolly Salter, MD   100 mg at 12/12/18 0851  . budesonide (PULMICORT) nebulizer solution 0.25 mg  0.25 mg Nebulization BID Jonah Blue, MD   0.25 mg at 12/12/18 0801  . carvedilol (COREG) tablet 6.25 mg  6.25 mg Oral BID WC Jonah Blue, MD   6.25 mg at 12/12/18 0850  . chlorpheniramine-HYDROcodone (TUSSIONEX) 10-8 MG/5ML suspension 5 mL  5 mL Oral QHS Rolly Salter, MD   5 mL at 12/11/18 2128  . dextromethorphan (DELSYM) 30 MG/5ML liquid 15 mg  15 mg Oral BID PRN Lynden Oxford  M, MD   15 mg at 12/09/18 1754  . dicyclomine (BENTYL) capsule 10 mg  10 mg Oral BID Jonah BlueYates, Jennifer, MD   10 mg at 12/12/18 0850  . docusate sodium (COLACE) capsule 100 mg  100 mg Oral BID Jonah BlueYates, Jennifer, MD   100 mg at 12/12/18 [id number]  . doxycycline (VIBRA-TABS) tablet 100 mg  100 mg Oral Q12H Rolly SalterPatel, Pranav M, MD   100 mg at 12/12/18 0853  . DULoxetine (CYMBALTA) DR capsule 60 mg  60 mg Oral Daily Jonah BlueYates, Jennifer, MD   60 mg at 12/12/18 [id number]  . enoxaparin (LOVENOX) injection 40 mg  40 mg Subcutaneous Q24H Jonah BlueYates, Jennifer, MD   40 mg at 12/11/18 1713  . fluticasone furoate-vilanterol (BREO ELLIPTA) 100-25 MCG/INH 1 puff  1 puff Inhalation Daily Jonah BlueYates, Jennifer, MD   1 puff at 12/12/18 0801  . furosemide (LASIX)  tablet 20 mg  20 mg Oral Daily Rolly SalterPatel, Pranav M, MD   20 mg at 12/12/18 0853  . guaiFENesin (MUCINEX) 12 hr tablet 600 mg  600 mg Oral BID Rolly SalterPatel, Pranav M, MD   600 mg at 12/12/18 [id number]  . insulin aspart (novoLOG) injection 0-20 Units  0-20 Units Subcutaneous TID WC Rolly SalterPatel, Pranav M, MD   7 Units at 12/12/18 0845  . insulin aspart (novoLOG) injection 0-5 Units  0-5 Units Subcutaneous QHS Rolly SalterPatel, Pranav M, MD   2 Units at 12/11/18 2128  . insulin aspart (novoLOG) injection 12 Units  12 Units Subcutaneous TID WC Rolly SalterPatel, Pranav M, MD   12 Units at 12/12/18 0830  . insulin glargine (LANTUS) injection 50 Units  50 Units Subcutaneous Daily Rolly SalterPatel, Pranav M, MD   50 Units at 12/12/18 [phone]  . ipratropium-albuterol (DUONEB) 0.5-2.5 (3) MG/3ML nebulizer solution 3 mL  3 mL Nebulization BID Rolly SalterPatel, Pranav M, MD   3 mL at 12/12/18 0801  . iron polysaccharides (NIFEREX) capsule 150 mg  150 mg Oral Daily Jonah BlueYates, Jennifer, MD   150 mg at 12/12/18 0850  . levETIRAcetam (KEPPRA) tablet 1,000 mg  1,000 mg Oral BID Jonah BlueYates, Jennifer, MD   1,000 mg at 12/12/18 [id number]  . methylPREDNISolone sodium succinate (SOLU-MEDROL) 40 mg/mL injection 40 mg  40 mg Intravenous Q12H Rolly SalterPatel, Pranav M, MD   40 mg at 12/12/18 0046  . ondansetron (ZOFRAN) tablet 4 mg  4 mg Oral Q6H PRN Jonah BlueYates, Jennifer, MD   4 mg at 12/07/18 1324   Or  . ondansetron (ZOFRAN) injection 4 mg  4 mg Intravenous Q6H PRN Jonah BlueYates, Jennifer, MD      . pantoprazole (PROTONIX) EC tablet 40 mg  40 mg Oral Daily Jonah BlueYates, Jennifer, MD   40 mg at 12/12/18 0851  . pregabalin (LYRICA) capsule 300 mg  300 mg Oral BID Jonah BlueYates, Jennifer, MD   300 mg at 12/11/18 2128     Discharge Medications: Please see discharge summary for a list of discharge medications.  Relevant Imaging Results:  Relevant Lab Results:   Additional Information SS#: [id number]  Margarito LinerSarah C Carrah Eppolito, LCSW

## 2018-12-12 NOTE — Clinical Social Work Note (Signed)
Clinical Social Work Assessment  Patient Details  Name: Tracy Riddle MRN: [id number] Date of Birth: Dec 30, 1970  Date of referral:  12/12/18               Reason for consult:  Discharge Planning                Permission sought to share information with:  Chartered certified accountant granted to share information::  Yes, Verbal Permission Granted  Name::        Agency::  [facility]  Relationship::     Contact Information:     Housing/Transportation Living arrangements for the past 2 months:  [place], [place] of Information:  Patient, Medical Team Patient Interpreter Needed:  None Criminal Activity/Legal Involvement Pertinent to Current Situation/Hospitalization:  No - Comment as needed Significant Relationships:  Spouse Lives with:  Spouse Do you feel safe going back to the place where you live?  Yes Need for family participation in patient care:  Yes (Comment)  Care giving concerns:  Patient is a short-term rehab resident at [facility]. PT recommending further rehab at discharge.   Social Worker assessment / plan:  CSW met with patient. No supports at bedside. CSW introduced role and explained that discharge planning would be discussed. Patient confirmed she was getting rehab at [facility] prior to admission and she would like to return for continued rehab at discharge. CSW left message for SNF clinical liaison to confirm patient can return. No further concerns. CSW encouraged patient to contact CSW as needed. CSW will continue to follow patient for support and facilitate discharge back to SNF once clinical liaison calls back.  Employment status:  Unemployed Forensic scientist:  Medicaid [place] PT Recommendations:  [place] / Referral to community resources:  [place]  Patient/Family's Response to care:  Patient agreeable to return to SNF. Patient's husband supportive  and involved in patient's care. Patient appreciated social work intervention.  Patient/Family's Understanding of and Emotional Response to Diagnosis, Current Treatment, and Prognosis:  Patient has a good understanding of the reason for admission and her need for continued rehab prior to returning home. Patient appears happy with hospital care.  Emotional Assessment Appearance:  Appears stated age Attitude/Demeanor/Rapport:  Engaged, Gracious Affect (typically observed):  Accepting, Appropriate, Calm, Pleasant Orientation:  Oriented to Self, Oriented to Place, Oriented to  Time, Oriented to Situation Alcohol / Substance use:  Never Used Psych involvement (Current and /or in the community):  No (Comment)  Discharge Needs  Concerns to be addressed:  Care Coordination Readmission within the last 30 days:  No Current discharge risk:  Dependent with Mobility Barriers to Discharge:  No Barriers Identified   Candie Chroman, LCSW 12/12/2018, 2:01 PM

## 2018-12-12 NOTE — Discharge Summary (Signed)
[facility] Discharge Summary   Patient: Tracy Riddle ZOX:[id number]   PCP: Camie Patience, FNP DOB: March 18, 1971   Date of admission: 12/07/2018   Date of discharge:  12/12/2018    Discharge Diagnoses:  Principal Problem:   Acute respiratory failure with hypoxia (HCC) Active Problems:   Sepsis (HCC)   Asthma, chronic, unspecified asthma severity, with acute exacerbation   Diabetes mellitus type 2 in obese [facility])  Admitted From: [facility] Disposition: [facility]  Recommendations for Outpatient Follow-up:  1. Please follow-up with PCP in 1 week  Follow-up Information    Camie Patience, FNP. Schedule an appointment as soon as possible for a visit in 1 week(s).   Specialty:  Family Medicine Contact information: [address] [place] [address] [phone]          Diet recommendation: Cardiac diet carb modified  Activity: The patient is advised to gradually reintroduce usual activities.  Discharge Condition: good  Code Status: Full code  History of present illness: As per the H and P dictated on admission, "Tracy Riddle is a 47 y.o. female with medical history significant of MS; GERD; DM; and asthma presenting with  SOB.She started coughing and was unable to catch her breath.  The facility called 911; [facility] was on diversion and so she was brought to [facility].  Coughing started last night.  Nonproductive cough.  +fever.  +wheezing.  She has h/o asthma, no h/o intubation.  She got better with BIPAP and is feeling better, now off BIPAP.  She was feeling well prior to yesterday.  No sick contacts.  ED Course:  Respiratory distress - usually seen at [facility].  Fever, cough, no CP.  H/o asthma - given steroids and neb by EMS.  Clinically appears to be PNA, covered for HCAP. Lactate normal so not given full bolus (possibly volume, unlikely).  Had been hypoxic into the 70s.  May be able to come off BIPAP soon"  Hospital Course:  Summary of her active problems in the  hospital is as following. Acute respiratory failure with hypoxia -Patient presented with acute respiratory failure requiring BIPAP -She was treated for sepsis with Cefepime and Vanc -Needed bipap on admission now on room air.   Sepsis POA -SIRS criteria in this patient includes:Fever, tachycardia, tachypnea -Patient hasNOevidence of acute organ failure at this time -While awaiting blood cultures, thismaybe a preseptic condition. -Sepsis protocol initiated -Blood and urine cultures not shows any growth.   Viral panel is also negative. -add doxycycline  Asthma exacerbation -She has history of severe asthma without history of intubation but with multiple hospital admissionsincluding 11/16-22. -Morbid obesity, intermittent tobacco useare likely exacerbatingfactors. -Nebulizers: prn albuterol andstanding Duonebs -Solu-Medrol60 mg IV BID, change to oral prednisone for taper. -Continue home Arnuity Ellipta (substituted Pulmicort per formulary and Breo Ellipta  Type 2 diabetes mellitus, uncontrolled with hyperglycemia -A1c was 11.7 on 11/17 -Continue Vitoza, Glucophage -Continue Lantus -Cover with sliding-scale insulin  History of seizures. 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  Social worker was consulted for transfer back to [facility]. On the day of the discharge  the patient's vitals are stable, and no other acute medical condition were reported by patient. the patient was felt safe to be discharge at [facility]  with therapy.  Consultants: none Procedures: noen  DISCHARGE MEDICATION: Allergies as of 12/12/2018      Reactions   Codeine Itching   Oxycodone Itching      Medication List    TAKE these medications   albuterol 108 (90 Base) MCG/ACT inhaler Commonly known as:  PROVENTIL HFA;VENTOLIN HFA Inhale 2 puffs into the lungs every 4 (four) hours as needed for wheezing or shortness of breath.   amitriptyline 10 MG tablet Commonly known as:  ELAVIL Take 30 mg by mouth at bedtime.   ARNUITY ELLIPTA 100 MCG/ACT Aepb Generic drug:  Fluticasone Furoate Inhale 1 puff into the lungs daily.   atorvastatin 20 MG tablet Commonly known as:  LIPITOR Take 20 mg by mouth at bedtime.   benzonatate 100 MG capsule Commonly known as:  TESSALON Take 1 capsule (100 mg total) by mouth 3 (three) times daily.   BREO ELLIPTA 100-25 MCG/INH Aepb Generic drug:  fluticasone furoate-vilanterol Inhale 1 puff into the lungs daily.   carvedilol 6.25 MG tablet Commonly known as:  COREG Take 6.25 mg by mouth 2 (two) times daily with a meal.   dextromethorphan 30 MG/5ML liquid Commonly known as:  DELSYM Take 2.5 mLs (15 mg total) by mouth 2 (two) times daily as needed for cough.   dicyclomine 10 MG capsule Commonly known as:  BENTYL Take 10 mg by mouth 2 (two) times daily.   docusate sodium 100 MG capsule Commonly known as:  COLACE Take 1 capsule (100 mg total) by mouth 2 (two) times daily.   doxycycline 100 MG tablet Commonly known as:  VIBRA-TABS Take 1 tablet (100 mg total) by mouth every 12 (twelve) hours for 2 days.   DULoxetine 60 MG capsule Commonly known as:  CYMBALTA Take 60 mg by mouth daily.   furosemide 20 MG tablet Commonly known as:  LASIX Take 20 mg by mouth as needed for edema.   guaiFENesin 600 MG 12 hr tablet Commonly known as:  MUCINEX Take 1 tablet (600 mg total) by mouth 2 (two) times daily.   insulin aspart 100 UNIT/ML injection Commonly known as:  novoLOG Inject 8  Units into the skin 3 (three) times daily before meals.   insulin glargine 100 UNIT/ML injection Commonly known as:  LANTUS Inject 45 Units into the skin daily.   ipratropium-albuterol 0.5-2.5 (3) MG/3ML Soln Commonly known as:  DUONEB Take 3 mLs by nebulization every 6 (six) hours as needed (for wheezing).   iron polysaccharides 150 MG capsule Commonly known as:  NIFEREX Take 150 mg by mouth daily.   levETIRAcetam 1000 MG tablet Commonly known as:  KEPPRA Take 1,000 mg by mouth 2 (two) times daily.   liraglutide 18 MG/3ML Sopn Commonly known as:  VICTOZA Inject 1.2 mg into the skin daily.   magnesium oxide 400 MG tablet Commonly known as:  MAG-OX Take 400 mg by mouth 2 (two) times daily. Notes to patient:  Twice a day.   meloxicam 15 MG tablet Commonly known as:  MOBIC Take 15 mg by mouth daily.   metFORMIN 1000 MG tablet Commonly known as:  GLUCOPHAGE Take 1,000 mg by mouth 2 (two) times daily with a meal.   METHEN-HYOSC-METH BLUE-NA PHOS PO Take 81.6 mg by mouth 4 (four) times daily.   pantoprazole 40 MG tablet Commonly known as:  PROTONIX Take  40 mg by mouth daily.   predniSONE 10 MG tablet Commonly known as:  DELTASONE Take 50mg  daily for 3days,Take 40mg  daily for 3days,Take 30mg  daily for 3days,Take 20mg  daily for 3days,Take 10mg  daily for 3days, then stop   pregabalin 150 MG capsule Commonly known as:  LYRICA Take 300 mg by mouth 2 (two) times daily.      Allergies  Allergen Reactions  . Codeine Itching  . Oxycodone Itching   Discharge Instructions    Diet - low sodium heart healthy   Complete by:  As directed    Discharge instructions   Complete by:  As directed    It is important that you read following instructions as well as go over your medication list with RN to help you understand your care after this hospitalization.  Discharge Instructions: Please follow-up with PCP in one week  Please request your primary care physician to go over  all Hospital Tests and Procedure/Radiological results at the follow up,  Please get all Hospital records sent to your PCP by signing hospital release before you go home.   Do not take more than prescribed Pain, Sleep and Anxiety Medications. You were cared for by a hospitalist during your hospital stay. If you have any questions about your discharge medications or the care you received while you were in the hospital after you are discharged, you can call the unit you were admitted to and ask to speak with the hospitalist on call if the hospitalist that took care of you is not available.  Once you are discharged, your primary care physician will handle any further medical issues. Please note that NO REFILLS for any discharge medications will be authorized once you are discharged, as it is imperative that you return to your primary care physician (or establish a relationship with a primary care physician if you do not have one) for your aftercare needs so that they can reassess your need for medications and monitor your lab values. You Must read complete instructions/literature along with all the possible adverse reactions/side effects for all the Medicines you take and that have been prescribed to you. Take any new Medicines after you have completely understood and accept all the possible adverse reactions/side effects. Wear Seat belts while driving. If you have smoked or chewed Tobacco in the last 2 yrs please stop smoking and/or stop any Recreational drug use.   Increase activity slowly   Complete by:  As directed      Discharge Exam: Filed Weights   12/07/18 0538 12/07/18 1122  Weight: 86.2 kg 86.2 kg   Vitals:   12/12/18 0802 12/12/18 0818  BP:  (!) 146/84  Pulse:  86  Resp:    Temp:  97.7 F (36.5 C)  SpO2: 92% 93%   General: Appear in mild distress, no Rash; Oral Mucosa moist Cardiovascular: S1 and S2 Present, no Murmur, no JVD Respiratory: Bilateral Air entry present and Clear to  Auscultation, no Crackles, occ wheezes Abdomen: Bowel Sound present, Soft and no tenderness Extremities: no Pedal edema, no calf tenderness Neurology: Grossly no focal neuro deficit.  The results of significant diagnostics from this hospitalization (including imaging, microbiology, ancillary and laboratory) are listed below for reference.    Significant Diagnostic Studies: Dg Chest Portable 1 View  Result Date: 12/07/2018 CLINICAL DATA:  Shortness of breath EXAM: PORTABLE CHEST 1 VIEW COMPARISON:  Chest radiograph 11/07/2018 FINDINGS: Shallow lung inflation. Patchy bilateral opacities, right greater than left. No pneumothorax or sizable pleural effusion. IMPRESSION:  Shallow lung inflation and patchy bilateral opacities, favored to be pulmonary edema. Electronically Signed   By: Deatra Robinson M.D.   On: 12/07/2018 06:16    Microbiology: Recent Results (from the past 240 hour(s))  Blood Culture (routine x 2)     Status: None   Collection Time: 12/07/18  5:50 AM  Result Value Ref Range Status   Specimen Description BLOOD RIGHT HAND  Final   Special Requests   Final    BOTTLES DRAWN AEROBIC AND ANAEROBIC Blood Culture results may not be optimal due to an excessive volume of blood received in culture bottles   Culture   Final    NO GROWTH 5 DAYS Performed at [facility], [address] [place], [place] [address]    Report Status 12/12/2018 FINAL  Final  Blood Culture (routine x 2)     Status: None   Collection Time: 12/07/18  5:54 AM  Result Value Ref Range Status   Specimen Description BLOOD LEFT HAND  Final   Special Requests   Final    BOTTLES DRAWN AEROBIC AND ANAEROBIC Blood Culture results may not be optimal due to an inadequate volume of blood received in culture bottles   Culture   Final    NO GROWTH 5 DAYS Performed at [facility], [address] [place], [place] [address]    Report Status 12/12/2018 FINAL  Final  MRSA PCR Screening     Status: Abnormal    Collection Time: 12/07/18 11:52 AM  Result Value Ref Range Status   MRSA by PCR POSITIVE (A) NEGATIVE Final    Comment:        The GeneXpert MRSA Assay (FDA approved for NASAL specimens only), is one component of a comprehensive MRSA colonization surveillance program. It is not intended to diagnose MRSA infection nor to guide or monitor treatment for MRSA infections. RESULT CALLED TO, READ BACK BY AND VERIFIED WITH: Debby Bud RN 14:15 12/07/18 ([email or web address]) Performed at [facility], [address] [place], [place] [address]   Respiratory Panel by PCR     Status: None   Collection Time: 12/07/18 11:52 AM  Result Value Ref Range Status   Adenovirus NOT DETECTED NOT DETECTED Final   Coronavirus 229E NOT DETECTED NOT DETECTED Final   Coronavirus HKU1 NOT DETECTED NOT DETECTED Final   Coronavirus NL63 NOT DETECTED NOT DETECTED Final   Coronavirus OC43 NOT DETECTED NOT DETECTED Final   Metapneumovirus NOT DETECTED NOT DETECTED Final   Rhinovirus / Enterovirus NOT DETECTED NOT DETECTED Final   Influenza A NOT DETECTED NOT DETECTED Final   Influenza B NOT DETECTED NOT DETECTED Final   Parainfluenza Virus 1 NOT DETECTED NOT DETECTED Final   Parainfluenza Virus 2 NOT DETECTED NOT DETECTED Final   Parainfluenza Virus 3 NOT DETECTED NOT DETECTED Final   Parainfluenza Virus 4 NOT DETECTED NOT DETECTED Final   Respiratory Syncytial Virus NOT DETECTED NOT DETECTED Final   Bordetella pertussis NOT DETECTED NOT DETECTED Final   Chlamydophila pneumoniae NOT DETECTED NOT DETECTED Final   Mycoplasma pneumoniae NOT DETECTED NOT DETECTED Final    Comment: Performed at [facility], [address], [place], [place] [address]     Labs: CBC: Recent Labs  Lab 12/07/18 0550 12/08/18 0251 12/10/18 0217 12/11/18 0220 12/12/18 0319  WBC 7.6 8.4 10.2 8.1 10.9*  NEUTROABS 6.4  --   --   --   --   HGB 9.9* 9.3* 9.4* 9.9* 10.8*  HCT 34.2* 31.1* 31.8* 33.3* 35.0*  MCV 76.9* 74.8* 74.6*  75.2* 74.9*  PLT 200 207 249 246 298   Basic Metabolic Panel: Recent Labs  Lab 12/07/18 0550 12/08/18 0251 12/09/18 1159 12/10/18 0217 12/11/18 0220 12/12/18 0319  NA 139 139  --  139 141 140  K 4.4 4.5  --  4.1 3.9 4.3  CL 102 103  --  99 98 100  CO2 29 27  --  30 34* 33*  GLUCOSE 142* 352* 471* 151* 100* 165*  BUN 15 19  --  21* 22* 22*  CREATININE 0.79 0.73  --  0.69 0.88 0.81  CALCIUM 8.7* 8.8*  --  9.2 9.0 9.2  MG  --   --   --  1.4*  --   --    Liver Function Tests: Recent Labs  Lab 12/07/18 0550  AST 26  ALT 30  ALKPHOS 96  BILITOT 0.6  PROT 5.9*  ALBUMIN 3.4*   No results for input(s): LIPASE, AMYLASE in the last 168 hours. No results for input(s): AMMONIA in the last 168 hours. Cardiac Enzymes: No results for input(s): CKTOTAL, CKMB, CKMBINDEX, TROPONINI in the last 168 hours. BNP (last 3 results) Recent Labs    12/07/18 0550  BNP 70.7   CBG: Recent Labs  Lab 12/11/18 1201 12/11/18 1726 12/11/18 2117 12/12/18 0815 12/12/18 1149  GLUCAP 262* 196* 203* 239* 289*   Time spent: 35 minutes  Signed:  Lynden Oxford  [facility]  12/12/2018  , 4:05 PM

## 2018-12-12 NOTE — Clinical Social Work Note (Addendum)
CSW facilitated patient discharge including contacting facility to confirm patient discharge plans. Patient will call her husband. Clinical information faxed to facility and family agreeable with plan. CSW arranged ambulance transport via [facility] to [facility] at 6:00. RN to call report prior to discharge [phone]).  CSW will sign off for now as social work intervention is no longer needed. Please consult [place] again if new needs arise.  Charlynn Court, CSW [phone]

## 2020-06-07 IMAGING — DX DG CHEST 1V PORT
1 series · 1 of 1 positions shown · non-contrast
Comparison: Chest radiograph 11/07/2018

CLINICAL DATA: Shortness of breath

EXAM:
PORTABLE CHEST 1 VIEW

[chest]
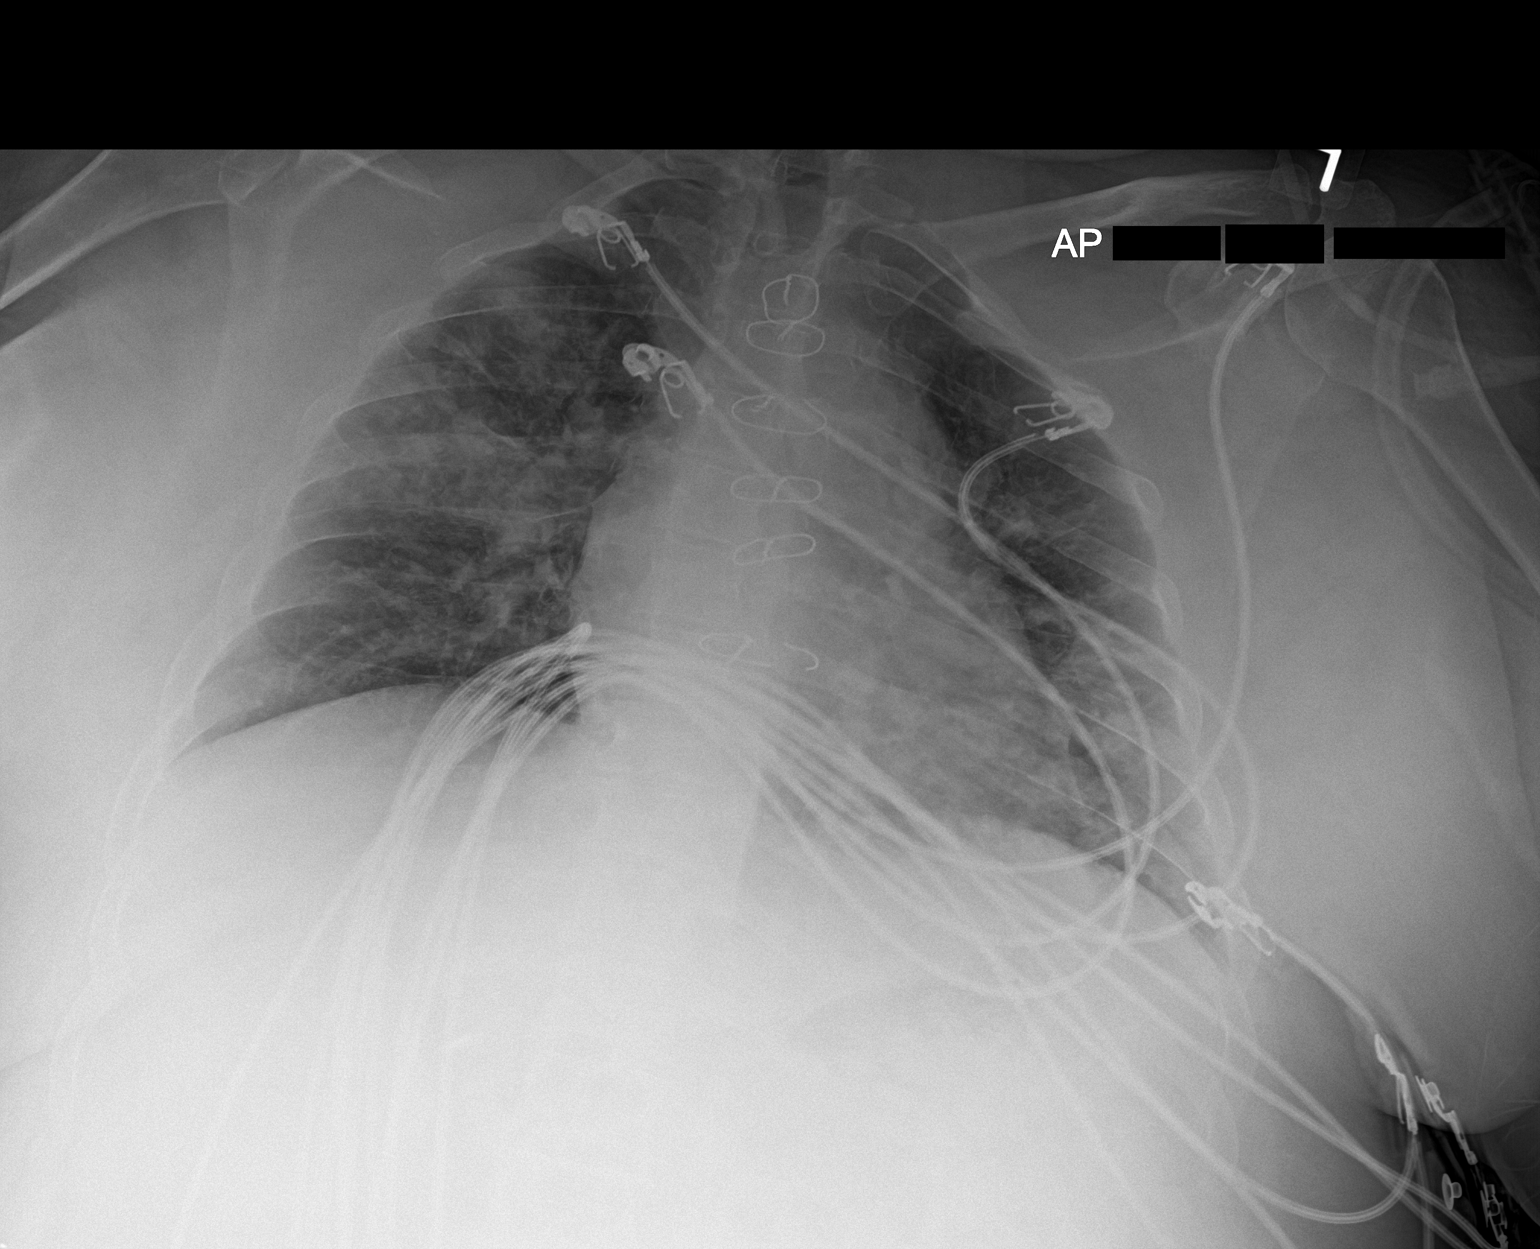

[1 of 1 positions shown; findings below may reference images not displayed]

FINDINGS: Shallow lung inflation. Patchy bilateral opacities, right greater
than left. No pneumothorax or sizable pleural effusion.
IMPRESSION: Shallow lung inflation and patchy bilateral opacities, favored to be
pulmonary edema.

## 2022-12-21 DEATH — deceased
